# Patient Record
Sex: Female | Born: 1990 | Race: Black or African American | Hispanic: No | Marital: Single | Smoking: Never smoker
Health system: Southern US, Community
[De-identification: ages and names within clinical notes are randomized; demographics above are authoritative.]

## PROBLEM LIST (undated history)

## (undated) DIAGNOSIS — I10 Essential (primary) hypertension: Secondary | ICD-10-CM

---

## 2013-03-26 ENCOUNTER — Encounter (HOSPITAL_COMMUNITY): Payer: Self-pay | Admitting: Emergency Medicine

## 2013-03-26 ENCOUNTER — Emergency Department (HOSPITAL_COMMUNITY)
Admission: EM | Admit: 2013-03-26 | Discharge: 2013-03-26 | Disposition: A | Discharge status: Home / Self Care | Payer: Medicaid Other | Attending: Emergency Medicine | Admitting: Emergency Medicine

## 2013-03-26 DIAGNOSIS — K029 Dental caries, unspecified: Secondary | ICD-10-CM | POA: Insufficient documentation

## 2013-03-26 DIAGNOSIS — K002 Abnormalities of size and form of teeth: Secondary | ICD-10-CM | POA: Insufficient documentation

## 2013-03-26 MED ORDER — PENICILLIN V POTASSIUM 500 MG PO TABS: 500.0000 mg | ORAL_TABLET | Freq: Four times a day (QID) | ORAL | Status: DC

## 2013-03-26 MED ORDER — HYDROCODONE-ACETAMINOPHEN 5-325 MG PO TABS: 1.0000 | ORAL_TABLET | Freq: Four times a day (QID) | ORAL | Status: DC | PRN

## 2013-03-26 MED ORDER — IBUPROFEN 800 MG PO TABS: 800.0000 mg | ORAL_TABLET | Freq: Three times a day (TID) | ORAL | Status: DC | PRN

## 2013-03-26 NOTE — ED Notes (Signed)
Pt c/o right bottom jaw toothache x 1 wk; not relieved by Otc

## 2013-03-26 NOTE — ED Provider Notes (Signed)
Medical screening examination/treatment/procedure(s) were performed by non-physician practitioner and as supervising physician I was immediately available for consultation/collaboration.  EKG Interpretation   None         Lyanne Co, MD 03/26/13 [phone]

## 2013-03-26 NOTE — ED Provider Notes (Signed)
CSN: [id number]     Arrival date & time 03/26/13  3474 History   First MD Initiated Contact with Patient 03/26/13 [phone]     Chief Complaint  Patient presents with  . Dental Pain   (Consider location/radiation/quality/duration/timing/severity/associated sxs/prior Treatment) HPI Patient presents to the emergency department with right lower dental pain.  The patient, states, that she's had emesis tooth for quite a while.  Patient, states, that she does not have a local dentist.  Patient does not have any nausea, vomiting, throat swelling, difficulty breathing, difficulty swallowing or neck swelling.  The patient, states, that she tried some over-the-counter dental gel without relief. History reviewed. No pertinent past medical history. History reviewed. No pertinent past surgical history. No family history on file. History  Substance Use Topics  . Smoking status: Never Smoker   . Smokeless tobacco: Not on file  . Alcohol Use: No   OB History   Grav Para Term Preterm Abortions TAB SAB Ect Mult Living                 Review of Systems All other systems negative except as documented in the HPI. All pertinent positives and negatives as reviewed in the HPI. Allergies  Review of patient's allergies indicates no known allergies.  Home Medications  No current outpatient prescriptions on file. BP 124/85  Pulse 101  Temp(Src) 98.2 F (36.8 C)  Resp 20  SpO2 100%  LMP 01/24/2013 Physical Exam  Nursing note and vitals reviewed. Constitutional: She appears well-developed and well-nourished. No distress.  HENT:  Head: Normocephalic and atraumatic.  Mouth/Throat: Uvula is midline and oropharynx is clear and moist. No trismus in the jaw. Abnormal dentition. Dental caries present. No uvula swelling.    Neck: Normal range of motion. Neck supple. No tracheal deviation present. No thyromegaly present.  Pulmonary/Chest: Effort normal.  Lymphadenopathy:    She has no cervical adenopathy.    Skin: Skin is warm and dry.    ED Course  Procedures (including critical care time) Patient be referred to the on-call dentist and placed on antibiotic for pain control.  She is advised to return here as needed.  Told to rinse with warm water and peroxide 3 times a day  Carlyle Dolly, PA-C 03/26/13 [phone]
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
## 2013-05-01 ENCOUNTER — Encounter (HOSPITAL_COMMUNITY): Payer: Self-pay | Admitting: Emergency Medicine

## 2013-05-01 ENCOUNTER — Emergency Department (HOSPITAL_COMMUNITY)
Admission: EM | Admit: 2013-05-01 | Discharge: 2013-05-01 | Disposition: A | Discharge status: Home / Self Care | Payer: Medicaid Other | Attending: Emergency Medicine | Admitting: Emergency Medicine

## 2013-05-01 DIAGNOSIS — R109 Unspecified abdominal pain: Secondary | ICD-10-CM | POA: Insufficient documentation

## 2013-05-01 DIAGNOSIS — Z3201 Encounter for pregnancy test, result positive: Secondary | ICD-10-CM | POA: Insufficient documentation

## 2013-05-01 DIAGNOSIS — Z3490 Encounter for supervision of normal pregnancy, unspecified, unspecified trimester: Secondary | ICD-10-CM

## 2013-05-01 DIAGNOSIS — O9989 Other specified diseases and conditions complicating pregnancy, childbirth and the puerperium: Secondary | ICD-10-CM | POA: Insufficient documentation

## 2013-05-01 DIAGNOSIS — Y9389 Activity, other specified: Secondary | ICD-10-CM | POA: Insufficient documentation

## 2013-05-01 LAB — POCT PREGNANCY, URINE: Preg Test, Ur: POSITIVE — AB

## 2013-05-01 LAB — ABO/RH: ABO/RH(D): O POS

## 2013-05-01 LAB — HCG, QUANTITATIVE, PREGNANCY: HCG, BETA CHAIN, QUANT, S: 151889 m[IU]/mL — AB (ref <5)

## 2013-05-01 MED ORDER — ACETAMINOPHEN 500 MG PO TABS: 500.0000 mg | ORAL_TABLET | Freq: Four times a day (QID) | ORAL | Status: DC | PRN
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
## 2013-05-01 NOTE — ED Notes (Signed)
PA at bedside.

## 2013-05-01 NOTE — ED Notes (Signed)
Last menstrual cycle was in October.
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
## 2013-05-01 NOTE — ED Notes (Signed)
Pelvic cart set up at pt bedside. Pt undressed from waist down

## 2013-05-01 NOTE — ED Provider Notes (Signed)
Medical screening examination/treatment/procedure(s) were conducted as a shared visit with non-physician practitioner(s) and myself.  I personally evaluated the patient during the encounter.  EKG Interpretation   None       EMERGENCY DEPARTMENT US PREGNANCY "Study: Limited Ultrasound of the Pelvis"  INDICATIONS:Pregnancy(required) Multiple views of the uterus and pelvic cavity are obtained with a multi-frequency probe.  APPROACH:Transabdominal   PERFORMED BY: Myself  IMAGES ARCHIVED?: Yes  LIMITATIONS: Body habitus  PREGNANCY FREE FLUID: None  PREGNANCY UTERUS FINDINGS:Uterus enlarged and Gestational sac noted ADNEXAL FINDINGS:Left ovary not seen and Right ovary not seen  PREGNANCY FINDINGS: Yolk sac noted, Fetal pole present and Fetal heart activity seen  INTERPRETATION: Viable intrauterine pregnancy  GESTATIONAL AGE, ESTIMATE: 11 weeks   FETAL HEART RATE: 140s  COMMENT(Estimate of Gestational Age):  11 weeks  D/c with ER precautions.  F/U OB.        Darlys Galesavid Tahjir Silveria, MD 05/01/13 [phone]

## 2013-05-01 NOTE — ED Notes (Signed)
Vital signs stable. 

## 2013-05-01 NOTE — ED Provider Notes (Signed)
Medical screening examination/treatment/procedure(s) were performed by non-physician practitioner and as supervising physician I was immediately available for consultation/collaboration.  EKG Interpretation   None       Benign exam.  IUP on TAUS.  rH positive.  OB f/u. ER precautions given.    Darlys Galesavid Davionte Lusby, MD 05/01/13 [phone]
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
## 2013-05-08 ENCOUNTER — Encounter: Payer: Self-pay | Admitting: *Deleted

## 2013-05-22 ENCOUNTER — Encounter: Payer: Self-pay | Admitting: Family Medicine

## 2013-06-19 ENCOUNTER — Emergency Department (HOSPITAL_COMMUNITY)
Admission: EM | Admit: 2013-06-19 | Discharge: 2013-06-19 | Disposition: A | Discharge status: Home / Self Care | Payer: Medicaid Other | Attending: Emergency Medicine | Admitting: Emergency Medicine

## 2013-06-19 ENCOUNTER — Encounter (HOSPITAL_COMMUNITY): Payer: Self-pay | Admitting: Emergency Medicine

## 2013-06-19 DIAGNOSIS — O9989 Other specified diseases and conditions complicating pregnancy, childbirth and the puerperium: Secondary | ICD-10-CM | POA: Insufficient documentation

## 2013-06-19 DIAGNOSIS — K0889 Other specified disorders of teeth and supporting structures: Secondary | ICD-10-CM

## 2013-06-19 DIAGNOSIS — G479 Sleep disorder, unspecified: Secondary | ICD-10-CM | POA: Insufficient documentation

## 2013-06-19 DIAGNOSIS — K089 Disorder of teeth and supporting structures, unspecified: Secondary | ICD-10-CM | POA: Insufficient documentation

## 2013-06-19 MED ORDER — PENICILLIN V POTASSIUM 500 MG PO TABS: 500.0000 mg | ORAL_TABLET | Freq: Four times a day (QID) | ORAL | Status: DC

## 2013-06-19 MED ORDER — BUPIVACAINE-EPINEPHRINE PF 0.5-1:200000 % IJ SOLN
1.8000 mL | Freq: Once | INTRAMUSCULAR | Status: AC
  Administered 2013-06-19: 9 mg

## 2013-06-19 NOTE — Discharge Instructions (Signed)

## 2013-06-19 NOTE — ED Provider Notes (Signed)
CSN: [id number]     Arrival date & time 06/19/13  0725 History   First MD Initiated Contact with Patient 06/19/13 0831     Chief Complaint  Patient presents with  . Dental Pain     (Consider location/radiation/quality/duration/timing/severity/associated sxs/prior Treatment) HPI Comments: Patient presents to the emergency department with chief complaint of dental pain. She states pain started approximately week ago. She is [redacted] weeks pregnant. She is tried taking OTC Tylenol with little relief. She states the pain is severe. It does not radiate. She denies any associated fevers or chills. [place] and asks for referral.  The history is provided by the patient. No language interpreter was used.    History reviewed. No pertinent past medical history. History reviewed. No pertinent past surgical history. No family history on file. History  Substance Use Topics  . Smoking status: Never Smoker   . Smokeless tobacco: Not on file  . Alcohol Use: No   OB History   Grav Para Term Preterm Abortions TAB SAB Ect Mult Living   1              Review of Systems  Constitutional: Negative for fever and chills.  HENT: Positive for dental problem. Negative for drooling.   Neurological: Negative for speech difficulty.  Psychiatric/Behavioral: Positive for sleep disturbance.      Allergies  Review of patient's allergies indicates no known allergies.  Home Medications   Current Outpatient Rx  Name  Route  Sig  Dispense  Refill  . acetaminophen (TYLENOL) 500 MG tablet   Oral   Take 500-1,000 mg by mouth every 4 (four) hours as needed for mild pain.          BP 123/90  Pulse 75  Temp(Src) 98.6 F (37 C)  Resp 20  SpO2 100%  LMP 02/16/2013 Physical Exam  Nursing note and vitals reviewed. Constitutional: She is oriented to person, place, and time. She appears well-developed and well-nourished.  HENT:  Head: Normocephalic and atraumatic.    Mouth/Throat:    Poor dentition throughout.  Affected tooth as diagrammed.  No signs of peritonsillar or tonsillar abscess.  No signs of gingival abscess. Oropharynx is clear and without exudates.  Uvula is midline.  Airway is intact. No signs of Ludwig's angina with palpation of oral and sublingual mucosa.   Eyes: Conjunctivae and EOM are normal.  Neck: Normal range of motion.  Cardiovascular: Normal rate.   Pulmonary/Chest: Effort normal.  Abdominal: She exhibits no distension.  Musculoskeletal: Normal range of motion.  Neurological: She is alert and oriented to person, place, and time.  Skin: Skin is dry.  Psychiatric: She has a normal mood and affect. Her behavior is normal. Judgment and thought content normal.    ED Course  Dental Date/Time: 06/19/2013 2:38 PM Performed by: Roxy HorsemanBROWNING, Odette Watanabe Authorized by: Roxy HorsemanBROWNING, Davionne Dowty Consent: Verbal consent obtained. Risks and benefits: risks, benefits and alternatives were discussed Consent given by: patient Patient understanding: patient states understanding of the procedure being performed Patient consent: the patient's understanding of the procedure matches consent given Procedure consent: procedure consent matches procedure scheduled Relevant documents: relevant documents present and verified Test results: test results available and properly labeled Site marked: the operative site was marked Imaging studies: imaging studies available Required items: required blood products, implants, devices, and special equipment available Patient identity confirmed: verbally with patient Time out: Immediately prior to procedure a "time out" was called to verify the correct patient, procedure,  equipment, support staff and site/side marked as required. Preparation: Patient was prepped and draped in the usual sterile fashion. Local anesthesia used: yes Anesthesia: local infiltration Local anesthetic: bupivacaine 0.5% with epinephrine Anesthetic  total: 1.8 ml Patient sedated: no Patient tolerance: Patient tolerated the procedure well with no immediate complications.   (including critical care time) Labs Review Labs Reviewed - No data to display Imaging Review No results found.   EKG Interpretation None      MDM   Final diagnoses:  Pain, dental    Patient with toothache.  No gross abscess.  Exam unconcerning for Ludwig's angina or spread of infection.  Will treat with penicillin and tylenol.  Urged patient to follow-up with dentist.      Roxy Horseman, PA-C 06/19/13 1439

## 2013-06-19 NOTE — ED Notes (Signed)
Top right toothache not relieved by otc; states is approximately [redacted] wks pregnant

## 2013-06-20 NOTE — ED Provider Notes (Signed)
Medical screening examination/treatment/procedure(s) were performed by non-physician practitioner and as supervising physician I was immediately available for consultation/collaboration.   EKG Interpretation None        Nalani Andreen, MD 06/20/13 0748 

## 2013-06-27 ENCOUNTER — Encounter: Payer: Self-pay | Admitting: Advanced Practice Midwife

## 2013-07-02 ENCOUNTER — Encounter: Payer: Self-pay | Admitting: Obstetrics & Gynecology

## 2013-07-28 ENCOUNTER — Emergency Department (HOSPITAL_COMMUNITY)
Admission: EM | Admit: 2013-07-28 | Discharge: 2013-07-28 | Disposition: A | Discharge status: Other Acute Inpatient Hospital | Payer: Medicaid Other | Source: Home / Self Care | Attending: Emergency Medicine | Admitting: Emergency Medicine

## 2013-07-28 ENCOUNTER — Emergency Department (HOSPITAL_COMMUNITY): Payer: Medicaid Other

## 2013-07-28 ENCOUNTER — Inpatient Hospital Stay (HOSPITAL_COMMUNITY)
Admission: EM | Admit: 2013-07-28 | Discharge: 2013-07-31 | DRG: 774 | Disposition: A | Discharge status: Home / Self Care | Payer: Medicaid Other | Source: Ambulatory Visit | Attending: Obstetrics & Gynecology | Admitting: Obstetrics & Gynecology

## 2013-07-28 ENCOUNTER — Inpatient Hospital Stay (HOSPITAL_COMMUNITY): Payer: Medicaid Other

## 2013-07-28 ENCOUNTER — Encounter (HOSPITAL_COMMUNITY): Payer: Self-pay | Admitting: Emergency Medicine

## 2013-07-28 ENCOUNTER — Encounter (HOSPITAL_COMMUNITY): Payer: Self-pay | Admitting: *Deleted

## 2013-07-28 DIAGNOSIS — Y9379 Activity, other specified sports and athletics: Secondary | ICD-10-CM | POA: Diagnosis not present

## 2013-07-28 DIAGNOSIS — O151 Eclampsia in labor: Secondary | ICD-10-CM | POA: Diagnosis present

## 2013-07-28 DIAGNOSIS — O0930 Supervision of pregnancy with insufficient antenatal care, unspecified trimester: Secondary | ICD-10-CM

## 2013-07-28 DIAGNOSIS — O411090 Infection of amniotic sac and membranes, unspecified, unspecified trimester, not applicable or unspecified: Secondary | ICD-10-CM | POA: Diagnosis present

## 2013-07-28 DIAGNOSIS — O364XX0 Maternal care for intrauterine death, not applicable or unspecified: Secondary | ICD-10-CM | POA: Diagnosis present

## 2013-07-28 DIAGNOSIS — O1500 Eclampsia in pregnancy, unspecified trimester: Secondary | ICD-10-CM | POA: Diagnosis present

## 2013-07-28 DIAGNOSIS — W1809XA Striking against other object with subsequent fall, initial encounter: Secondary | ICD-10-CM | POA: Diagnosis present

## 2013-07-28 DIAGNOSIS — Y92009 Unspecified place in unspecified non-institutional (private) residence as the place of occurrence of the external cause: Secondary | ICD-10-CM

## 2013-07-28 DIAGNOSIS — O159 Eclampsia, unspecified as to time period: Secondary | ICD-10-CM

## 2013-07-28 DIAGNOSIS — R561 Post traumatic seizures: Secondary | ICD-10-CM

## 2013-07-28 DIAGNOSIS — O1502 Eclampsia in pregnancy, second trimester: Secondary | ICD-10-CM

## 2013-07-28 LAB — COMPREHENSIVE METABOLIC PANEL
ALBUMIN: 3.1 g/dL — AB (ref 3.5–5.2)
ALT: 18 U/L (ref 0–35)
ALT: 19 U/L (ref 0–35)
AST: 42 U/L — ABNORMAL HIGH (ref 0–37)
AST: 37 U/L (ref 0–37)
Albumin: 2.7 g/dL — ABNORMAL LOW (ref 3.5–5.2)
Alkaline Phosphatase: 118 U/L — ABNORMAL HIGH (ref 39–117)
Alkaline Phosphatase: 126 U/L — ABNORMAL HIGH (ref 39–117)
BUN: 11 mg/dL (ref 6–23)
BUN: 12 mg/dL (ref 6–23)
CALCIUM: 8.9 mg/dL (ref 8.4–10.5)
CO2: 17 mEq/L — ABNORMAL LOW (ref 19–32)
CO2: 20 mEq/L (ref 19–32)
Calcium: 9.1 mg/dL (ref 8.4–10.5)
Chloride: 103 mEq/L (ref 96–112)
Chloride: 99 mEq/L (ref 96–112)
Creatinine, Ser: 0.84 mg/dL (ref 0.50–1.10)
Creatinine, Ser: 0.8 mg/dL (ref 0.50–1.10)
GFR calc Af Amer: >90 mL/min (ref >90)
GFR calc non Af Amer: >90 mL/min (ref >90)
Glucose, Bld: 87 mg/dL (ref 70–99)
Glucose, Bld: 125 mg/dL — ABNORMAL HIGH (ref 70–99)
Potassium: 4.4 mEq/L (ref 3.7–5.3)
Potassium: 4.5 mEq/L (ref 3.7–5.3)
SODIUM: 137 meq/L (ref 137–147)
SODIUM: 135 meq/L — AB (ref 137–147)
TOTAL PROTEIN: 6.5 g/dL (ref 6.0–8.3)
TOTAL PROTEIN: 6.8 g/dL (ref 6.0–8.3)
Total Bilirubin: 0.2 mg/dL — ABNORMAL LOW (ref 0.3–1.2)
Total Bilirubin: 0.3 mg/dL (ref 0.3–1.2)

## 2013-07-28 LAB — CBC
HCT: 34.9 % — ABNORMAL LOW (ref 36.0–46.0)
Hemoglobin: 11 g/dL — ABNORMAL LOW (ref 12.0–15.0)
MCH: 22.8 pg — AB (ref 26.0–34.0)
MCHC: 31.5 g/dL (ref 30.0–36.0)
MCV: 72.3 fL — AB (ref 78.0–100.0)
PLATELETS: 285 10*3/uL (ref 150–400)
RBC: 4.83 MIL/uL (ref 3.87–5.11)
RDW: 18.9 % — AB (ref 11.5–15.5)
WBC: 17.3 10*3/uL — ABNORMAL HIGH (ref 4.0–10.5)

## 2013-07-28 LAB — CBC WITH DIFFERENTIAL/PLATELET
Basophils Absolute: 0.1 10*3/uL (ref 0.0–0.1)
Basophils Relative: 1 % (ref 0–1)
Eosinophils Absolute: 0.4 10*3/uL (ref 0.0–0.7)
Eosinophils Relative: 4 % (ref 0–5)
HEMATOCRIT: 30.7 % — AB (ref 36.0–46.0)
Hemoglobin: 9.4 g/dL — ABNORMAL LOW (ref 12.0–15.0)
LYMPHS PCT: 25 % (ref 12–46)
Lymphs Abs: 2.7 10*3/uL (ref 0.7–4.0)
MCH: 22.5 pg — ABNORMAL LOW (ref 26.0–34.0)
MCHC: 30.6 g/dL (ref 30.0–36.0)
MCV: 73.4 fL — ABNORMAL LOW (ref 78.0–100.0)
MONOS PCT: 4 % (ref 3–12)
Monocytes Absolute: 0.5 10*3/uL (ref 0.1–1.0)
NEUTROS ABS: 7.3 10*3/uL (ref 1.7–7.7)
NEUTROS PCT: 67 % (ref 43–77)
Platelets: 251 10*3/uL (ref 150–400)
RBC: 4.18 MIL/uL (ref 3.87–5.11)
RDW: 18.2 % — ABNORMAL HIGH (ref 11.5–15.5)
WBC: 11 10*3/uL — AB (ref 4.0–10.5)

## 2013-07-28 LAB — I-STAT CG4 LACTIC ACID, ED: Lactic Acid, Venous: 5.49 mmol/L — ABNORMAL HIGH (ref 0.5–2.2)

## 2013-07-28 LAB — TYPE AND SCREEN
ABO/RH(D): O POS
ABO/RH(D): O POS
ANTIBODY SCREEN: NEGATIVE
ANTIBODY SCREEN: NEGATIVE

## 2013-07-28 LAB — URINE MICROSCOPIC-ADD ON

## 2013-07-28 LAB — URINALYSIS, ROUTINE W REFLEX MICROSCOPIC
Bilirubin Urine: NEGATIVE
Glucose, UA: NEGATIVE mg/dL
KETONES UR: NEGATIVE mg/dL
Leukocytes, UA: NEGATIVE
NITRITE: NEGATIVE
Protein, ur: 100 mg/dL — AB
SPECIFIC GRAVITY, URINE: 1.025 (ref 1.005–1.030)
Urobilinogen, UA: 0.2 mg/dL (ref 0.0–1.0)
pH: 6 (ref 5.0–8.0)

## 2013-07-28 LAB — CBG MONITORING, ED: Glucose-Capillary: 95 mg/dL (ref 70–99)

## 2013-07-28 LAB — ETHANOL

## 2013-07-28 LAB — SALICYLATE LEVEL: Salicylate Lvl: <2 mg/dL — ABNORMAL LOW (ref 2.8–20.0)

## 2013-07-28 LAB — ACETAMINOPHEN LEVEL

## 2013-07-28 LAB — CK: CK TOTAL: 166 U/L (ref 7–177)

## 2013-07-28 LAB — PROTEIN / CREATININE RATIO, URINE
Creatinine, Urine: 51.92 mg/dL
Protein Creatinine Ratio: 6.67 — ABNORMAL HIGH (ref 0.00–0.15)
Total Protein, Urine: 346.5 mg/dL

## 2013-07-28 LAB — HCG, QUANTITATIVE, PREGNANCY: HCG, BETA CHAIN, QUANT, S: 12377 m[IU]/mL — AB (ref <5)

## 2013-07-28 MED ORDER — HYDRALAZINE HCL 20 MG/ML IJ SOLN
10.0000 mg | Freq: Once | INTRAMUSCULAR | Status: AC
  Administered 2013-07-28: 10 mg via INTRAVENOUS
  Filled 2013-07-28: qty 1

## 2013-07-28 MED ORDER — LACTATED RINGERS IV BOLUS (SEPSIS)
300.0000 mL | Freq: Once | INTRAVENOUS | Status: AC
  Administered 2013-07-28: 300 mL via INTRAVENOUS

## 2013-07-28 MED ORDER — PRENATAL MULTIVITAMIN CH: 1.0000 | ORAL_TABLET | Freq: Every day | ORAL | Status: DC

## 2013-07-28 MED ORDER — MAGNESIUM SULFATE 40 G IN LACTATED RINGERS - SIMPLE
1.0000 g/h | INTRAVENOUS | Status: DC
  Administered 2013-07-29: 1 g/h via INTRAVENOUS
  Filled 2013-07-28 (×2): qty 500

## 2013-07-28 MED ORDER — ACETAMINOPHEN 325 MG PO TABS
650.0000 mg | ORAL_TABLET | ORAL | Status: DC | PRN
  Administered 2013-07-28 – 2013-07-29 (×2): 650 mg via ORAL
  Filled 2013-07-28 (×2): qty 2

## 2013-07-28 MED ORDER — CALCIUM CARBONATE ANTACID 500 MG PO CHEW: 2.0000 | CHEWABLE_TABLET | ORAL | Status: DC | PRN

## 2013-07-28 MED ORDER — MAGNESIUM SULFATE 40 G IN LACTATED RINGERS - SIMPLE
2.0000 g/h | INTRAVENOUS | Status: DC
  Administered 2013-07-28: 2 g/h via INTRAVENOUS
  Filled 2013-07-28: qty 500

## 2013-07-28 MED ORDER — NALBUPHINE HCL 10 MG/ML IJ SOLN
10.0000 mg | INTRAMUSCULAR | Status: DC | PRN
  Administered 2013-07-28 – 2013-07-29 (×2): 10 mg via INTRAVENOUS
  Filled 2013-07-28 (×2): qty 1

## 2013-07-28 MED ORDER — LACTATED RINGERS IV BOLUS (SEPSIS)
500.0000 mL | Freq: Once | INTRAVENOUS | Status: AC
  Administered 2013-07-28: 09:00:00 via INTRAVENOUS

## 2013-07-28 MED ORDER — BETAMETHASONE SOD PHOS & ACET 6 (3-3) MG/ML IJ SUSP
12.0000 mg | INTRAMUSCULAR | Status: AC
  Administered 2013-07-28 – 2013-07-29 (×2): 12 mg via INTRAMUSCULAR
  Filled 2013-07-28 (×2): qty 2

## 2013-07-28 MED ORDER — SODIUM CHLORIDE 0.9 % IV BOLUS (SEPSIS)
1000.0000 mL | Freq: Once | INTRAVENOUS | Status: DC
Start: 2013-07-28 — End: 2013-07-28

## 2013-07-28 MED ORDER — ZOLPIDEM TARTRATE 5 MG PO TABS: 5.0000 mg | ORAL_TABLET | Freq: Every evening | ORAL | Status: DC | PRN

## 2013-07-28 MED ORDER — LACTATED RINGERS IV SOLN
INTRAVENOUS | Status: DC
  Administered 2013-07-28 – 2013-07-29 (×5): via INTRAVENOUS

## 2013-07-28 MED ORDER — HYDRALAZINE HCL 20 MG/ML IJ SOLN: 10.0000 mg | Freq: Once | INTRAMUSCULAR | Status: DC

## 2013-07-28 MED ORDER — ONDANSETRON HCL 4 MG/2ML IJ SOLN
4.0000 mg | Freq: Once | INTRAMUSCULAR | Status: AC
  Administered 2013-07-28: 4 mg via INTRAVENOUS
  Filled 2013-07-28: qty 2

## 2013-07-28 MED ORDER — DOCUSATE SODIUM 100 MG PO CAPS
100.0000 mg | ORAL_CAPSULE | Freq: Every day | ORAL | Status: DC
Start: 2013-07-28 — End: 2013-07-30

## 2013-07-28 MED ORDER — MAGNESIUM SULFATE 40 MG/ML IJ SOLN
4.0000 g | Freq: Once | INTRAMUSCULAR | Status: AC
  Administered 2013-07-28: 4 g via INTRAVENOUS
  Filled 2013-07-28: qty 100
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
## 2013-07-28 NOTE — Progress Notes (Signed)
Spoke with Belenda CruiseHeather Mitchell RNC, Care Coordinator, [facility], L&D. Pt is to go to room 170 when she is transferred.

## 2013-07-28 NOTE — ED Notes (Signed)
Abnormal Labs given to Dr. Bebe ShaggyWickline Lactic Acid 5.49 mmol/L

## 2013-07-28 NOTE — Progress Notes (Signed)
Dr Debroah LoopArnold at bedside to assess patient. Urine output reviewed with 300ml bolus ordered at this time. Zofran also ordered for nausea/vomiting. Vitals reviewed. Monitoring closely.
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
## 2013-07-28 NOTE — Progress Notes (Signed)
Patient ID: Jaclyne Haverstick, female   DOB: [birth year], 23 y.o.   MRN: [id number] FACULTY PRACTICE ANTEPARTUM(COMPREHENSIVE) NOTE 2 Emireth Gerlich is a 23 y.o. G1P0 at [redacted]w[redacted]d by midtrimester ultrasound who is admitted for eclampsia.   Fetal presentation is cephalic. Length of Stay:  0  Days  Subjective: Headache is better, has nausea Patient reports the fetal movement as active. Patient reports uterine contraction  activity as none. Patient reports  vaginal bleeding as none. Patient describes fluid per vagina as None.  Vitals:  Blood pressure 159/91, pulse 113, temperature 98.1 F (36.7 C), temperature source Oral, resp. rate 16, height 5\' 7"  (1.702 m), weight 120 lb (54.432 kg), last menstrual period 02/16/2013, SpO2 99.00%. Physical Examination:  General appearance - appears fatigued, face mildly puffy Heart - normal rate and regular rhythm Abdomen - soft, nontender, nondistended Fundal Height:  Size equals dates Cervical Exam: Not evaluated.  Extremities: extremities normal, atraumatic,  Membranes:intact  Fetal Monitoring:  Baseline: 130 bpm, Variability: Fair (1-6 bpm), Accelerations: Non-reactive but appropriate for gestational age and Decelerations: Absent  Labs:  Results for orders placed during the hospital encounter of 07/28/13 (from the past 24 hour(s))  URINALYSIS, ROUTINE W REFLEX MICROSCOPIC   Collection Time    07/28/13  9:20 AM      Result Value Ref Range   Color, Urine YELLOW  YELLOW   APPearance CLEAR  CLEAR   Specific Gravity, Urine 1.025  1.005 - 1.030   pH 6.0  5.0 - 8.0   Glucose, UA NEGATIVE  NEGATIVE mg/dL   Hgb urine dipstick SMALL (*) NEGATIVE   Bilirubin Urine NEGATIVE  NEGATIVE   Ketones, ur NEGATIVE  NEGATIVE mg/dL   Protein, ur 409 (*) NEGATIVE mg/dL   Urobilinogen, UA 0.2  0.0 - 1.0 mg/dL   Nitrite NEGATIVE  NEGATIVE   Leukocytes, UA NEGATIVE  NEGATIVE  PROTEIN / CREATININE RATIO, URINE   Collection Time    07/28/13  9:20 AM      Result  Value Ref Range   Creatinine, Urine 51.92     Total Protein, Urine 346.5     PROTEIN CREATININE RATIO 6.67 (*) 0.00 - 0.15  URINE MICROSCOPIC-ADD ON   Collection Time    07/28/13  9:20 AM      Result Value Ref Range   Squamous Epithelial / LPF RARE  RARE   RBC / HPF 0-2  <3 RBC/hpf   Casts GRANULAR CAST (*) NEGATIVE  CBC   Collection Time    07/28/13  6:55 PM      Result Value Ref Range   WBC 17.3 (*) 4.0 - 10.5 K/uL   RBC 4.83  3.87 - 5.11 MIL/uL   Hemoglobin 11.0 (*) 12.0 - 15.0 g/dL   HCT 81.1 (*) 91.4 - 78.2 %   MCV 72.3 (*) 78.0 - 100.0 fL   MCH 22.8 (*) 26.0 - 34.0 pg   MCHC 31.5  30.0 - 36.0 g/dL   RDW 95.6 (*) 21.3 - 08.6 %   Platelets 285  150 - 400 K/uL  COMPREHENSIVE METABOLIC PANEL   Collection Time    07/28/13  6:55 PM      Result Value Ref Range   Sodium 135 (*) 137 - 147 mEq/L   Potassium 4.5  3.7 - 5.3 mEq/L   Chloride 99  96 - 112 mEq/L   CO2 20  19 - 32 mEq/L   Glucose, Bld 125 (*) 70 - 99 mg/dL   BUN 12  6 -  23 mg/dL   Creatinine, Ser 6.96  0.50 - 1.10 mg/dL   Calcium 8.9  8.4 - 29.5 mg/dL   Total Protein 6.8  6.0 - 8.3 g/dL   Albumin 3.1 (*) 3.5 - 5.2 g/dL   AST 37  0 - 37 U/L   ALT 19  0 - 35 U/L   Alkaline Phosphatase 126 (*) 39 - 117 U/L   Total Bilirubin 0.3  0.3 - 1.2 mg/dL   GFR calc non Af Amer >90  >90 mL/min   GFR calc Af Amer >90  >90 mL/min  MAGNESIUM   Collection Time    07/28/13  6:55 PM      Result Value Ref Range   Magnesium 7.7 (*) 1.5 - 2.5 mg/dL  TYPE AND SCREEN   Collection Time    07/28/13  6:55 PM      Result Value Ref Range   ABO/RH(D) O POS     Antibody Screen NEG     Sample Expiration 07/31/2013    CBG MONITORING, ED   Collection Time    07/28/13  7:14 AM      Result Value Ref Range   Glucose-Capillary 95  70 - 99 mg/dL  TYPE AND SCREEN   Collection Time    07/28/13  7:34 AM      Result Value Ref Range   ABO/RH(D) O POS     Antibody Screen NEG     Sample Expiration 07/31/2013    COMPREHENSIVE METABOLIC  PANEL   Collection Time    07/28/13  7:43 AM      Result Value Ref Range   Sodium 137  137 - 147 mEq/L   Potassium 4.4  3.7 - 5.3 mEq/L   Chloride 103  96 - 112 mEq/L   CO2 17 (*) 19 - 32 mEq/L   Glucose, Bld 87  70 - 99 mg/dL   BUN 11  6 - 23 mg/dL   Creatinine, Ser 2.84  0.50 - 1.10 mg/dL   Calcium 9.1  8.4 - 13.2 mg/dL   Total Protein 6.5  6.0 - 8.3 g/dL   Albumin 2.7 (*) 3.5 - 5.2 g/dL   AST 42 (*) 0 - 37 U/L   ALT 18  0 - 35 U/L   Alkaline Phosphatase 118 (*) 39 - 117 U/L   Total Bilirubin 0.2 (*) 0.3 - 1.2 mg/dL   GFR calc non Af Amer >90  >90 mL/min   GFR calc Af Amer >90  >90 mL/min  CBC WITH DIFFERENTIAL   Collection Time    07/28/13  7:43 AM      Result Value Ref Range   WBC 11.0 (*) 4.0 - 10.5 K/uL   RBC 4.18  3.87 - 5.11 MIL/uL   Hemoglobin 9.4 (*) 12.0 - 15.0 g/dL   HCT 44.0 (*) 10.2 - 72.5 %   MCV 73.4 (*) 78.0 - 100.0 fL   MCH 22.5 (*) 26.0 - 34.0 pg   MCHC 30.6  30.0 - 36.0 g/dL   RDW 36.6 (*) 44.0 - 34.7 %   Platelets 251  150 - 400 K/uL   Neutrophils Relative % 67  43 - 77 %   Neutro Abs 7.3  1.7 - 7.7 K/uL   Lymphocytes Relative 25  12 - 46 %   Lymphs Abs 2.7  0.7 - 4.0 K/uL   Monocytes Relative 4  3 - 12 %   Monocytes Absolute 0.5  0.1 - 1.0 K/uL   Eosinophils Relative  4  0 - 5 %   Eosinophils Absolute 0.4  0.0 - 0.7 K/uL   Basophils Relative 1  0 - 1 %   Basophils Absolute 0.1  0.0 - 0.1 K/uL  ACETAMINOPHEN LEVEL   Collection Time    07/28/13  7:43 AM      Result Value Ref Range   Acetaminophen (Tylenol), Serum <15.0  10 - 30 ug/mL  CK   Collection Time    07/28/13  7:43 AM      Result Value Ref Range   Total CK 166  7 - 177 U/L  ETHANOL   Collection Time    07/28/13  7:43 AM      Result Value Ref Range   Alcohol, Ethyl (B) <11  0 - 11 mg/dL  SALICYLATE LEVEL   Collection Time    07/28/13  7:43 AM      Result Value Ref Range   Salicylate Lvl <2.0 (*) 2.8 - 20.0 mg/dL  HCG, QUANTITATIVE, PREGNANCY   Collection Time    07/28/13  7:43  AM      Result Value Ref Range   hCG, Beta Chain, Quant, S [id number] (*) <5 mIU/mL  I-STAT CG4 LACTIC ACID, ED   Collection Time    07/28/13  8:02 AM      Result Value Ref Range   Lactic Acid, Venous 5.49 (*) 0.5 - 2.2 mmol/L    Imaging Studies:      Currently EPIC will not allow sonographic studies to automatically populate into notes.  In the meantime, copy and paste results into note or free text.  Medications:  Scheduled . betamethasone acetate-betamethasone sodium phosphate  12 mg Intramuscular Q24H  . docusate sodium  100 mg Oral Daily  . hydrALAZINE  10 mg Intramuscular Once  . prenatal multivitamin  1 tablet Oral Q1200   I have reviewed the patient's current medications.  ASSESSMENT: Patient Active Problem List   Diagnosis Date Noted  . Eclampsia complicating pregnancy in second trimester 07/28/2013  . Seizure after head injury 07/28/2013    PLAN: Magnesium rate decreased, follow urine output and BP, MFM  Assessment in am  Adam PhenixJames G Asier Desroches 07/28/2013,9:33 PM

## 2013-07-28 NOTE — ED Notes (Signed)
[facility] OB at bedside 

## 2013-07-28 NOTE — ED Notes (Signed)
Per EMS - pt's boyfriend woke up and saw pt having seizure-like activity, called ems. Upon Ems arrival, pt was post-ictal. Pt fell last night, hit head on the concrete. No hx of seizures. En route pt had another seizure, ems started 20 G in left forearm and administered 5 mg of versed. Pt also recently found out she was pregnant, unsure how far along. HR 128, CBG 95, 100% on room air, lung sounds clear. Right sided gaze.

## 2013-07-28 NOTE — Progress Notes (Signed)
Pt unresponsive. Moves when touched, but does not respond to voice. PLaced on fetal monitor. FHR 150 BPM.
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
## 2013-07-28 NOTE — Progress Notes (Addendum)
Medicated for high BP.  Lab at bedside for blood draw.

## 2013-07-28 NOTE — ED Notes (Signed)
Pt transported by RN to CT.

## 2013-07-28 NOTE — Progress Notes (Signed)
Spoke with Belenda CruiseHeather Mitchell RNC. Pt is [facility] by [facility].

## 2013-07-28 NOTE — Progress Notes (Signed)
US at bedside.  Decel down to 40 noted on US for 10 sec.

## 2013-07-28 NOTE — Progress Notes (Addendum)
CRITICAL VALUE ALERT  Critical value received:  Magnesium 7.7  Date of notification:  07/28/13  Time of notification:  2003  Critical value read back:yes  Nurse who received alert:  h Awa Bachicha rnc  MD notified (1st page):  Hart RochesterLawson, CNM  Time of first page:  2005  MD notified (2nd page):  Time of second page:  Responding MD:  Dickey GaveLawson CNM  Time MD responded:  2005

## 2013-07-28 NOTE — Progress Notes (Signed)
Hart RochesterLawson, CNM notified of mag level of 7.7. Plan to decrease magnesium to 1gram/hr. Urine output reviewed. Continuing to monitor closely.

## 2013-07-28 NOTE — Progress Notes (Signed)
SCD's in place.

## 2013-07-29 ENCOUNTER — Encounter (HOSPITAL_COMMUNITY): Payer: Medicaid Other | Admitting: Anesthesiology

## 2013-07-29 ENCOUNTER — Inpatient Hospital Stay (HOSPITAL_COMMUNITY): Payer: Medicaid Other | Admitting: Anesthesiology

## 2013-07-29 ENCOUNTER — Encounter (HOSPITAL_COMMUNITY): Payer: Self-pay | Admitting: Anesthesiology

## 2013-07-29 ENCOUNTER — Encounter (HOSPITAL_COMMUNITY): Payer: Self-pay | Admitting: Obstetrics & Gynecology

## 2013-07-29 ENCOUNTER — Ambulatory Visit (HOSPITAL_COMMUNITY): Payer: Medicaid Other

## 2013-07-29 DIAGNOSIS — O364XX0 Maternal care for intrauterine death, not applicable or unspecified: Secondary | ICD-10-CM

## 2013-07-29 DIAGNOSIS — O411090 Infection of amniotic sac and membranes, unspecified, unspecified trimester, not applicable or unspecified: Secondary | ICD-10-CM

## 2013-07-29 DIAGNOSIS — O0930 Supervision of pregnancy with insufficient antenatal care, unspecified trimester: Secondary | ICD-10-CM

## 2013-07-29 DIAGNOSIS — O151 Eclampsia in labor: Secondary | ICD-10-CM

## 2013-07-29 LAB — CBC
HCT: 31.2 % — ABNORMAL LOW (ref 36.0–46.0)
HEMATOCRIT: 32.2 % — AB (ref 36.0–46.0)
HEMOGLOBIN: 10 g/dL — AB (ref 12.0–15.0)
HEMOGLOBIN: 9.7 g/dL — AB (ref 12.0–15.0)
MCH: 22.5 pg — AB (ref 26.0–34.0)
MCH: 22.6 pg — AB (ref 26.0–34.0)
MCHC: 31.1 g/dL (ref 30.0–36.0)
MCHC: 31.1 g/dL (ref 30.0–36.0)
MCV: 72.4 fL — AB (ref 78.0–100.0)
MCV: 72.6 fL — ABNORMAL LOW (ref 78.0–100.0)
Platelets: 295 10*3/uL (ref 150–400)
Platelets: 263 10*3/uL (ref 150–400)
RBC: 4.45 MIL/uL (ref 3.87–5.11)
RBC: 4.3 MIL/uL (ref 3.87–5.11)
RDW: 19.4 % — ABNORMAL HIGH (ref 11.5–15.5)
RDW: 19.5 % — ABNORMAL HIGH (ref 11.5–15.5)
WBC: 20.6 10*3/uL — ABNORMAL HIGH (ref 4.0–10.5)
WBC: 20.2 10*3/uL — ABNORMAL HIGH (ref 4.0–10.5)

## 2013-07-29 LAB — COMPREHENSIVE METABOLIC PANEL
ALT: 18 U/L (ref 0–35)
ALT: 18 U/L (ref 0–35)
AST: 35 U/L (ref 0–37)
AST: 35 U/L (ref 0–37)
Albumin: 2.9 g/dL — ABNORMAL LOW (ref 3.5–5.2)
Albumin: 2.8 g/dL — ABNORMAL LOW (ref 3.5–5.2)
Alkaline Phosphatase: 111 U/L (ref 39–117)
Alkaline Phosphatase: 104 U/L (ref 39–117)
BILIRUBIN TOTAL: 0.2 mg/dL — AB (ref 0.3–1.2)
BUN: 13 mg/dL (ref 6–23)
BUN: 14 mg/dL (ref 6–23)
CALCIUM: 8.1 mg/dL — AB (ref 8.4–10.5)
CALCIUM: 7.8 mg/dL — AB (ref 8.4–10.5)
CHLORIDE: 102 meq/L (ref 96–112)
CHLORIDE: 104 meq/L (ref 96–112)
CO2: 22 mEq/L (ref 19–32)
CO2: 23 meq/L (ref 19–32)
CREATININE: 0.92 mg/dL (ref 0.50–1.10)
Creatinine, Ser: 0.9 mg/dL (ref 0.50–1.10)
GFR calc Af Amer: >90 mL/min (ref >90)
GFR, EST NON AFRICAN AMERICAN: 86 mL/min — AB (ref >90)
GFR, EST NON AFRICAN AMERICAN: 88 mL/min — AB (ref >90)
GLUCOSE: 96 mg/dL (ref 70–99)
Glucose, Bld: 114 mg/dL — ABNORMAL HIGH (ref 70–99)
Potassium: 4.6 mEq/L (ref 3.7–5.3)
Potassium: 4.8 mEq/L (ref 3.7–5.3)
Sodium: 137 mEq/L (ref 137–147)
Sodium: 138 mEq/L (ref 137–147)
Total Bilirubin: 0.3 mg/dL (ref 0.3–1.2)
Total Protein: 6.6 g/dL (ref 6.0–8.3)
Total Protein: 6.4 g/dL (ref 6.0–8.3)

## 2013-07-29 LAB — PROTEIN, URINE, 24 HOUR
Collection Interval-UPROT: 24 hours
Protein, 24H Urine: 2954 mg/d — ABNORMAL HIGH (ref 50–100)
Protein, Urine: 211 mg/dL
Urine Total Volume-UPROT: 1400 mL

## 2013-07-29 LAB — RPR

## 2013-07-29 LAB — OB RESULTS CONSOLE ABO/RH: RH Type: POSITIVE

## 2013-07-29 LAB — RAPID URINE DRUG SCREEN, HOSP PERFORMED
Amphetamines: NOT DETECTED
Barbiturates: NOT DETECTED
Benzodiazepines: POSITIVE — AB
COCAINE: NOT DETECTED
Tetrahydrocannabinol: NOT DETECTED

## 2013-07-29 LAB — CREATININE, URINE, 24 HOUR
CREATININE 24H UR: 1008 mg/d (ref 700–1800)
CREATININE, URINE: 72 mg/dL
Collection Interval-UCRE24: 24 hours
Urine Total Volume-UCRE24: 1400 mL

## 2013-07-29 LAB — URIC ACID: Uric Acid, Serum: 7.3 mg/dL — ABNORMAL HIGH (ref 2.4–7.0)

## 2013-07-29 LAB — MAGNESIUM: MAGNESIUM: 7.7 mg/dL — AB (ref 1.5–2.5)

## 2013-07-29 LAB — GROUP B STREP BY PCR: Group B strep by PCR: NEGATIVE

## 2013-07-29 LAB — HEPATITIS B SURFACE ANTIGEN: Hepatitis B Surface Ag: NEGATIVE

## 2013-07-29 LAB — ABO/RH: ABO/RH(D): O POS

## 2013-07-29 LAB — OB RESULTS CONSOLE GBS: GBS: NEGATIVE

## 2013-07-29 MED ORDER — LABETALOL HCL 5 MG/ML IV SOLN
10.0000 mg | INTRAVENOUS | Status: DC | PRN
  Administered 2013-07-29 – 2013-07-31 (×4): 10 mg via INTRAVENOUS
  Filled 2013-07-29 (×4): qty 4

## 2013-07-29 MED ORDER — EPHEDRINE 5 MG/ML INJ
10.0000 mg | INTRAVENOUS | Status: DC | PRN
  Filled 2013-07-29: qty 2

## 2013-07-29 MED ORDER — FENTANYL 2.5 MCG/ML BUPIVACAINE 1/10 % EPIDURAL INFUSION (WH - ANES)
INTRAMUSCULAR | Status: DC | PRN
  Administered 2013-07-29: 12 mL/h via EPIDURAL

## 2013-07-29 MED ORDER — DEXTROSE-NACL 5-0.45 % IV SOLN
INTRAVENOUS | Status: DC
  Administered 2013-07-29: 15:00:00 via INTRAVENOUS

## 2013-07-29 MED ORDER — LIDOCAINE HCL (PF) 1 % IJ SOLN
INTRAMUSCULAR | Status: DC | PRN
  Administered 2013-07-29: 4 mL
  Administered 2013-07-29: 3 mL

## 2013-07-29 MED ORDER — MISOPROSTOL 200 MCG PO TABS
50.0000 ug | ORAL_TABLET | ORAL | Status: DC
  Administered 2013-07-29 (×2): 50 ug via VAGINAL
  Filled 2013-07-29 (×2): qty 0.5

## 2013-07-29 MED ORDER — OXYTOCIN 40 UNITS IN LACTATED RINGERS INFUSION - SIMPLE MED
INTRAVENOUS | Status: AC
  Administered 2013-07-29: 40 [IU] via INTRAVENOUS
  Filled 2013-07-29: qty 1000

## 2013-07-29 MED ORDER — FENTANYL 2.5 MCG/ML BUPIVACAINE 1/10 % EPIDURAL INFUSION (WH - ANES)
14.0000 mL/h | INTRAMUSCULAR | Status: DC | PRN
  Filled 2013-07-29: qty 125

## 2013-07-29 MED ORDER — PHENYLEPHRINE 40 MCG/ML (10ML) SYRINGE FOR IV PUSH (FOR BLOOD PRESSURE SUPPORT)
80.0000 ug | PREFILLED_SYRINGE | INTRAVENOUS | Status: DC | PRN
  Filled 2013-07-29: qty 2
  Filled 2013-07-29: qty 10

## 2013-07-29 MED ORDER — OXYTOCIN 40 UNITS IN LACTATED RINGERS INFUSION - SIMPLE MED
62.5000 mL/h | INTRAVENOUS | Status: DC
  Administered 2013-07-29: 40 [IU] via INTRAVENOUS

## 2013-07-29 MED ORDER — LACTATED RINGERS IV SOLN
500.0000 mL | Freq: Once | INTRAVENOUS | Status: AC
  Administered 2013-07-29: 250 mL via INTRAVENOUS

## 2013-07-29 MED ORDER — PHENYLEPHRINE 40 MCG/ML (10ML) SYRINGE FOR IV PUSH (FOR BLOOD PRESSURE SUPPORT)
80.0000 ug | PREFILLED_SYRINGE | INTRAVENOUS | Status: DC | PRN
  Filled 2013-07-29: qty 2

## 2013-07-29 MED ORDER — DIPHENHYDRAMINE HCL 50 MG/ML IJ SOLN: 12.5000 mg | INTRAMUSCULAR | Status: DC | PRN

## 2013-07-29 MED ORDER — LIDOCAINE HCL (PF) 1 % IJ SOLN
INTRAMUSCULAR | Status: AC
  Filled 2013-07-29: qty 30

## 2013-07-29 MED ORDER — NALBUPHINE HCL 10 MG/ML IJ SOLN
10.0000 mg | Freq: Once | INTRAMUSCULAR | Status: DC
  Filled 2013-07-29: qty 1

## 2013-07-29 MED ORDER — DIPHENHYDRAMINE HCL 50 MG/ML IJ SOLN
25.0000 mg | Freq: Once | INTRAMUSCULAR | Status: DC
  Filled 2013-07-29: qty 1

## 2013-07-29 MED ORDER — MAGNESIUM SULFATE 40 G IN LACTATED RINGERS - SIMPLE
2.0000 g/h | INTRAVENOUS | Status: AC
  Administered 2013-07-30: 2 g/h via INTRAVENOUS
  Filled 2013-07-29: qty 500

## 2013-07-29 NOTE — Anesthesia Procedure Notes (Signed)
Epidural Patient location during procedure: OB Start time: 07/29/2013 8:33 PM  Staffing Anesthesiologist: Demontae Antunes A. Performed by: anesthesiologist   Preanesthetic Checklist Completed: patient identified, site marked, surgical consent, pre-op evaluation, timeout performed, IV checked, risks and benefits discussed and monitors and equipment checked  Epidural Patient position: sitting Prep: site prepped and draped and DuraPrep Patient monitoring: continuous pulse ox and blood pressure Approach: midline Location: L3-L4 Injection technique: LOR air  Needle:  Needle type: Tuohy  Needle gauge: 17 G Needle length: 9 cm and 9 Needle insertion depth: 5 cm cm Catheter type: closed end flexible Catheter size: 19 Gauge Catheter at skin depth: 10 cm Test dose: negative and Other  Assessment Events: blood not aspirated, injection not painful, no injection resistance, negative IV test and no paresthesia  Additional Notes Patient identified. Risks and benefits discussed including failed block, incomplete  Pain control, post dural puncture headache, nerve damage, paralysis, blood pressure Changes, nausea, vomiting, reactions to medications-both toxic and allergic and post Partum back pain. All questions were answered. Patient expressed understanding and wished to proceed. Sterile technique was used throughout procedure. Epidural site was Dressed with sterile barrier dressing. No paresthesias, signs of intravascular injection Or signs of intrathecal spread were encountered.  Patient was more comfortable after the epidural was dosed. Please see RN's note for documentation of vital signs and FHR which are stable.

## 2013-07-29 NOTE — Progress Notes (Signed)
Deborah French is a 23 y.o. G1P0 at 2497w1d admitted for induction of labor due to eclampsia.  Subjective:   Objective: BP 158/90  Pulse 102  Temp(Src) 98.2 F (36.8 C) (Axillary)  Resp 16  Ht 5\' 7"  (1.702 m)  Wt 54.432 kg (120 lb)  BMI 18.79 kg/m2  SpO2 99%  LMP 02/16/2013 I/O last 3 completed shifts: In: 4445.1 [P.O.:960; I.V.:2685.1; IV Piggyback:800] Out: 1960 [Urine:1785; Emesis/NG output:175] Total I/O In: 1139.5 [P.O.:10; I.V.:1129.5] Out: 460 [Urine:460]  Filed Vitals:   07/29/13 1708 07/29/13 1713 07/29/13 1718 07/29/13 1722  BP:    153/84  Pulse: 98 94 102 97  Temp:      TempSrc:      Resp:      Height:      Weight:      SpO2: 100% 99% 99%    FHT: not monitoring UC:   No ctx SVE:   Dilation: Closed Effacement (%): Thick Station: -3 Exam by:: Deborah French RNC  Labs: Lab Results  Component Value Date   WBC 20.6* 07/29/2013   HGB 10.0* 07/29/2013   HCT 32.2* 07/29/2013   MCV 72.4* 07/29/2013   PLT 295 07/29/2013   Results for orders placed during the hospital encounter of 07/28/13 (from the past 24 hour(s))  CBC     Status: Abnormal   Collection Time    07/28/13  6:55 PM      Result Value Ref Range   WBC 17.3 (*) 4.0 - 10.5 K/uL   RBC 4.83  3.87 - 5.11 MIL/uL   Hemoglobin 11.0 (*) 12.0 - 15.0 g/dL   HCT 84.634.9 (*) 96.236.0 - 95.246.0 %   MCV 72.3 (*) 78.0 - 100.0 fL   MCH 22.8 (*) 26.0 - 34.0 pg   MCHC 31.5  30.0 - 36.0 g/dL   RDW 84.118.9 (*) 32.411.5 - 40.115.5 %   Platelets 285  150 - 400 K/uL  COMPREHENSIVE METABOLIC PANEL     Status: Abnormal   Collection Time    07/28/13  6:55 PM      Result Value Ref Range   Sodium 135 (*) 137 - 147 mEq/L   Potassium 4.5  3.7 - 5.3 mEq/L   Chloride 99  96 - 112 mEq/L   CO2 20  19 - 32 mEq/L   Glucose, Bld 125 (*) 70 - 99 mg/dL   BUN 12  6 - 23 mg/dL   Creatinine, Ser 0.270.80  0.50 - 1.10 mg/dL   Calcium 8.9  8.4 - 25.310.5 mg/dL   Total Protein 6.8  6.0 - 8.3 g/dL   Albumin 3.1 (*) 3.5 - 5.2 g/dL   AST 37  0 - 37 U/L   ALT  19  0 - 35 U/L   Alkaline Phosphatase 126 (*) 39 - 117 U/L   Total Bilirubin 0.3  0.3 - 1.2 mg/dL   GFR calc non Af Amer >90  >90 mL/min   GFR calc Af Amer >90  >90 mL/min  TYPE AND SCREEN     Status: None   Collection Time    07/28/13  6:55 PM      Result Value Ref Range   ABO/RH(D) O POS     Antibody Screen NEG     Sample Expiration 07/31/2013    MAGNESIUM     Status: Abnormal   Collection Time    07/28/13  6:55 PM      Result Value Ref Range   Magnesium 7.7 (*) 1.5 - 2.5 mg/dL  ABO/RH     Status: None   Collection Time    07/28/13  6:55 PM      Result Value Ref Range   ABO/RH(D) O POS    RPR     Status: None   Collection Time    07/29/13 11:50 AM      Result Value Ref Range   RPR NON REAC  NON REAC  HEPATITIS B SURFACE ANTIGEN     Status: None   Collection Time    07/29/13 11:50 AM      Result Value Ref Range   Hepatitis B Surface Ag NEGATIVE  NEGATIVE  CBC     Status: Abnormal   Collection Time    07/29/13 11:50 AM      Result Value Ref Range   WBC 20.6 (*) 4.0 - 10.5 K/uL   RBC 4.45  3.87 - 5.11 MIL/uL   Hemoglobin 10.0 (*) 12.0 - 15.0 g/dL   HCT 16.132.2 (*) 09.636.0 - 04.546.0 %   MCV 72.4 (*) 78.0 - 100.0 fL   MCH 22.5 (*) 26.0 - 34.0 pg   MCHC 31.1  30.0 - 36.0 g/dL   RDW 40.919.4 (*) 81.111.5 - 91.415.5 %   Platelets 295  150 - 400 K/uL  COMPREHENSIVE METABOLIC PANEL     Status: Abnormal   Collection Time    07/29/13 11:50 AM      Result Value Ref Range   Sodium 137  137 - 147 mEq/L   Potassium 4.6  3.7 - 5.3 mEq/L   Chloride 102  96 - 112 mEq/L   CO2 22  19 - 32 mEq/L   Glucose, Bld 96  70 - 99 mg/dL   BUN 13  6 - 23 mg/dL   Creatinine, Ser 7.820.92  0.50 - 1.10 mg/dL   Calcium 8.1 (*) 8.4 - 10.5 mg/dL   Total Protein 6.6  6.0 - 8.3 g/dL   Albumin 2.9 (*) 3.5 - 5.2 g/dL   AST 35  0 - 37 U/L   ALT 18  0 - 35 U/L   Alkaline Phosphatase 111  39 - 117 U/L   Total Bilirubin 0.3  0.3 - 1.2 mg/dL   GFR calc non Af Amer 86 (*) >90 mL/min   GFR calc Af Amer >90  >90 mL/min   URIC ACID     Status: Abnormal   Collection Time    07/29/13 11:50 AM      Result Value Ref Range   Uric Acid, Serum 7.3 (*) 2.4 - 7.0 mg/dL  GROUP B STREP BY PCR     Status: None   Collection Time    07/29/13 12:30 PM      Result Value Ref Range   Group B strep by PCR NEGATIVE  NEGATIVE  URINE RAPID DRUG SCREEN (HOSP PERFORMED)     Status: Abnormal   Collection Time    07/29/13 12:30 PM      Result Value Ref Range   Opiates RESULTS UNAVAILABLE DUE TO INTERFERING SUBSTANCE (*) NONE DETECTED   Cocaine NONE DETECTED  NONE DETECTED   Benzodiazepines POSITIVE (*) NONE DETECTED   Amphetamines NONE DETECTED  NONE DETECTED   Tetrahydrocannabinol NONE DETECTED  NONE DETECTED   Barbiturates NONE DETECTED  NONE DETECTED  OB RESULTS CONSOLE GBS     Status: None   Collection Time    07/29/13  4:18 PM      Result Value Ref Range   GBS Negative    OB  RESULTS CONSOLE ABO/RH     Status: None   Collection Time    07/29/13  4:18 PM      Result Value Ref Range   RH Type  Positive     ABO Grouping O      Assessment / Plan: Induction of labor due to eclampsia,  rec'd cytotec  Labor: Progressing normally Preeclampsia:  on magnesium sulfate, intake and ouput balanced and slightly increasing Cr Fetal Wellbeing: Not monitoring. WIll call NICU if possible viability at delivery Pain Control:  Epidural and Fentanyl PRN I/D:  n/a Anticipated MOD:  NSVD  Deborah French 07/29/2013, 5:19 PM

## 2013-07-29 NOTE — Plan of Care (Signed)
Problem: Consults Goal: Birthing Suites Patient Information Press F2 to bring up selections list Outcome: Completed/Met Date Met:  07/29/13  Pt < [redacted] weeks EGA, Inpatient induction, PIH (Pregnancy induced hypertension) and IUFD (Intrauterine fetal demise)

## 2013-07-29 NOTE — Anesthesia Preprocedure Evaluation (Signed)
Anesthesia Evaluation  Patient identified by MRN, date of birth, ID band Patient awake    Reviewed: Allergy & Precautions, H&P , NPO status , Patient's Chart, lab work & pertinent test results  Airway Mallampati: III TM Distance: >3 FB Neck ROM: Full    Dental no notable dental hx. (+) Teeth Intact   Pulmonary neg pulmonary ROS,  breath sounds clear to auscultation  Pulmonary exam normal       Cardiovascular hypertension, Pt. on medications Rhythm:Regular Rate:Normal     Neuro/Psych Seizures -,  Eclampsia this admission negative psych ROS   GI/Hepatic negative GI ROS, Neg liver ROS,   Endo/Other  negative endocrine ROS  Renal/GU negative Renal ROS  negative genitourinary   Musculoskeletal negative musculoskeletal ROS (+)   Abdominal   Peds  Hematology negative hematology ROS (+)   Anesthesia Other Findings   Reproductive/Obstetrics (+) Pregnancy 22 weeks AOG Eclampsia Limited prenatal care                           Anesthesia Physical Anesthesia Plan  ASA: III  Anesthesia Plan: Epidural   Post-op Pain Management:    Induction:   Airway Management Planned: Natural Airway  Additional Equipment:   Intra-op Plan:   Post-operative Plan:   Informed Consent: I have reviewed the patients History and Physical, chart, labs and discussed the procedure including the risks, benefits and alternatives for the proposed anesthesia with the patient or authorized representative who has indicated his/her understanding and acceptance.     Plan Discussed with: Anesthesiologist  Anesthesia Plan Comments:         Anesthesia Quick Evaluation

## 2013-07-29 NOTE — Consult Note (Signed)
Neonatology Consult to Antenatal Patient: 07/29/2013 1:41 PM    I was requested by Dr Erin FullingHarraway-Smith to see this patient in order to provide antenatal counseling due to eclamptic seizures of unknown gestational age.    [name] is a 23 y/o Primigravida with no prenatal care, who was admitted yesterday for eclamptic seizures and unknown gestational age (between 2720-23 weeks???).  Had a history of fall while playing basketball and followed by seizures hours later.  She is currently currently "not" having active labor with elevated BP's in the 160's systolic and 100's diastolic,proteinuria and elevated creatinine level.  She is on Magnesium Sulfate but not on anti-hypertensives medications at present time. Deborah GA composite is 22 1/7 weeks per OB but based on one measurement of the cerebellum from ultrasound yesterday she could be as far along as 23 weeks. However, there were also other measurements suggesting that she could be closer to [redacted] weeks gestation.  Estimated fetal weight is between 423-430 grams.   I spoke with Deborah French and Deborah French in Room 170 in the presence of Dr. Erin FullingHarraway-Smith, Dr. Vincenza HewsQuinn (MFM) and Deborah L&D nurse.   I discussed in detail what to expect in Deborah case of unknown gestational age and estimated fetal weight.   Discussed that based on information from both OB and MFM, infant is most likely not viable.  Morbidity and mortality based on present information is not very promising and was discussed in detail.   There are 2 notable factors that have an impact on chances of survival namely, gestational age and birth weight.  In this case, mother's LMP does not match with the ultrasound data.  Based on [facility], about less than 10% of babies born at 6222- [redacted] weeks gestation survive but it is rare for an infant less than 500 grams to do so. Survival is also based on moderate, severe to profound neurodevelopmental impairment.  Resuscitation of an infant born less than 500 grams is  most probably not an option, given that 2.0 ETT is not available.  I emphasized with the couple that the survival rate is based only on estimation given the uncertainty of dating and again that overall survival rates are low given the estimated fetal size (423-430 grams).   Both Deborah French and Deborah French seem to understand my explanation and that the most important thing right now is the safety of Deborah French since she already had an eclamptic seizure and delivery of the fetus/infant is the only treatment.  They also understand that if there is any question of viability after infant is born, then [facility] to assess viability.   The couple had a few questions which I answered.  Thank you for asking us to see this patient and allowing us to participate in Deborah care.  Please call if there are any further questions.   Overton MamMary Ann T Nezzie Manera, MD (Attending Neonatologist)  Total length of face-to-face or floor/unit time for this encounter was 20 minutes. Counseling and/or coordination of care was greater than fifty percent of the time.

## 2013-07-29 NOTE — Plan of Care (Signed)
Problem: Consults Goal: Birthing Suites Patient Information Press F2 to bring up selections list  Outcome: Completed/Met Date Met:  07/29/13  Pt < [redacted] weeks EGA, Inpatient induction, PIH (Pregnancy induced hypertension) and IUFD (Intrauterine fetal demise)

## 2013-07-29 NOTE — Progress Notes (Signed)
Chaplain paged at 2134 and arrived in unit at 2152.  [name] requested a chaplain to come after delivery to bless [name] who died prior to birth. 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 This rite was important to her grief process and supports of her spiritual beliefs.   Preliminary grief support and counsel was provided.  Recommend Daytime Chaplain follow up. At present [name] is still feeling the affects of her procedure and needs rest in order to better deal with the aftermath of the loss of her first birth child, Deborah French.  Benjie Karvonenharles D. [name], DMin Chaplain

## 2013-07-29 NOTE — Progress Notes (Addendum)
Patient ID: Deborah French, female   DOB: 16-Aug-1990, 23 y.o.   MRN: [id number] FACULTY PRACTICE ANTEPARTUM(COMPREHENSIVE) NOTE Deborah French is a 23 y.o. G1P0 at [redacted]w[redacted]d by midtrimester ultrasound who is admitted for seizure, eclampsia. By unofficial bedside US in Jan 23.5 weeks.   Fetal presentation is cephalic. Length of Stay:  1  Days  Subjective: headache better Patient reports the fetal movement as active. Patient reports uterine contraction  activity as none. Patient reports  vaginal bleeding as none. Patient describes fluid per vagina as None.  Vitals:  Blood pressure 135/76, pulse 105, temperature 98 F (36.7 C), temperature source Oral, resp. rate 16, height 5\' 7"  (1.702 m), weight 120 lb (54.432 kg), last menstrual period 02/16/2013, SpO2 97.00%. Physical Examination:  General appearance - alert, well appearing, and in no distress Heart - normal rate and regular rhythm Abdomen - soft, nontender, nondistended Fundal Height:  size equals dates Cervical Exam: Not evaluated.  Extremities: extremities normal, atraumatic, no cyanosis or edema and Homans sign is negative, no sign of DVT with DTRs 2+ bilaterally Membranes:intact  Fetal Monitoring:  Baseline: 130 bpm, Variability: Fair (1-6 bpm), Accelerations: Non-reactive but appropriate for gestational age and Decelerations: Absent  Labs:  Results for orders placed during the hospital encounter of 07/28/13 (from the past 24 hour(s))  URINALYSIS, ROUTINE W REFLEX MICROSCOPIC   Collection Time    07/28/13  9:20 AM      Result Value Ref Range   Color, Urine YELLOW  YELLOW   APPearance CLEAR  CLEAR   Specific Gravity, Urine 1.025  1.005 - 1.030   pH 6.0  5.0 - 8.0   Glucose, UA NEGATIVE  NEGATIVE mg/dL   Hgb urine dipstick SMALL (*) NEGATIVE   Bilirubin Urine NEGATIVE  NEGATIVE   Ketones, ur NEGATIVE  NEGATIVE mg/dL   Protein, ur 409 (*) NEGATIVE mg/dL   Urobilinogen, UA 0.2  0.0 - 1.0 mg/dL   Nitrite NEGATIVE  NEGATIVE    Leukocytes, UA NEGATIVE  NEGATIVE  PROTEIN / CREATININE RATIO, URINE   Collection Time    07/28/13  9:20 AM      Result Value Ref Range   Creatinine, Urine 51.92     Total Protein, Urine 346.5     PROTEIN CREATININE RATIO 6.67 (*) 0.00 - 0.15  URINE MICROSCOPIC-ADD ON   Collection Time    07/28/13  9:20 AM      Result Value Ref Range   Squamous Epithelial / LPF RARE  RARE   RBC / HPF 0-2  <3 RBC/hpf   Casts GRANULAR CAST (*) NEGATIVE  CBC   Collection Time    07/28/13  6:55 PM      Result Value Ref Range   WBC 17.3 (*) 4.0 - 10.5 K/uL   RBC 4.83  3.87 - 5.11 MIL/uL   Hemoglobin 11.0 (*) 12.0 - 15.0 g/dL   HCT 81.1 (*) 91.4 - 78.2 %   MCV 72.3 (*) 78.0 - 100.0 fL   MCH 22.8 (*) 26.0 - 34.0 pg   MCHC 31.5  30.0 - 36.0 g/dL   RDW 95.6 (*) 21.3 - 08.6 %   Platelets 285  150 - 400 K/uL  COMPREHENSIVE METABOLIC PANEL   Collection Time    07/28/13  6:55 PM      Result Value Ref Range   Sodium 135 (*) 137 - 147 mEq/L   Potassium 4.5  3.7 - 5.3 mEq/L   Chloride 99  96 - 112 mEq/L   CO2  20  19 - 32 mEq/L   Glucose, Bld 125 (*) 70 - 99 mg/dL   BUN 12  6 - 23 mg/dL   Creatinine, Ser 1.610.80  0.50 - 1.10 mg/dL   Calcium 8.9  8.4 - 09.610.5 mg/dL   Total Protein 6.8  6.0 - 8.3 g/dL   Albumin 3.1 (*) 3.5 - 5.2 g/dL   AST 37  0 - 37 U/L   ALT 19  0 - 35 U/L   Alkaline Phosphatase 126 (*) 39 - 117 U/L   Total Bilirubin 0.3  0.3 - 1.2 mg/dL   GFR calc non Af Amer >90  >90 mL/min   GFR calc Af Amer >90  >90 mL/min  MAGNESIUM   Collection Time    07/28/13  6:55 PM      Result Value Ref Range   Magnesium 7.7 (*) 1.5 - 2.5 mg/dL  TYPE AND SCREEN   Collection Time    07/28/13  6:55 PM      Result Value Ref Range   ABO/RH(D) O POS     Antibody Screen NEG     Sample Expiration 07/31/2013    ABO/RH   Collection Time    07/28/13  6:55 PM      Result Value Ref Range   ABO/RH(D) O POS    CBG MONITORING, ED   Collection Time    07/28/13  7:14 AM      Result Value Ref Range    Glucose-Capillary 95  70 - 99 mg/dL  TYPE AND SCREEN   Collection Time    07/28/13  7:34 AM      Result Value Ref Range   ABO/RH(D) O POS     Antibody Screen NEG     Sample Expiration 07/31/2013    COMPREHENSIVE METABOLIC PANEL   Collection Time    07/28/13  7:43 AM      Result Value Ref Range   Sodium 137  137 - 147 mEq/L   Potassium 4.4  3.7 - 5.3 mEq/L   Chloride 103  96 - 112 mEq/L   CO2 17 (*) 19 - 32 mEq/L   Glucose, Bld 87  70 - 99 mg/dL   BUN 11  6 - 23 mg/dL   Creatinine, Ser 0.450.84  0.50 - 1.10 mg/dL   Calcium 9.1  8.4 - 40.910.5 mg/dL   Total Protein 6.5  6.0 - 8.3 g/dL   Albumin 2.7 (*) 3.5 - 5.2 g/dL   AST 42 (*) 0 - 37 U/L   ALT 18  0 - 35 U/L   Alkaline Phosphatase 118 (*) 39 - 117 U/L   Total Bilirubin 0.2 (*) 0.3 - 1.2 mg/dL   GFR calc non Af Amer >90  >90 mL/min   GFR calc Af Amer >90  >90 mL/min  CBC WITH DIFFERENTIAL   Collection Time    07/28/13  7:43 AM      Result Value Ref Range   WBC 11.0 (*) 4.0 - 10.5 K/uL   RBC 4.18  3.87 - 5.11 MIL/uL   Hemoglobin 9.4 (*) 12.0 - 15.0 g/dL   HCT 81.130.7 (*) 91.436.0 - 78.246.0 %   MCV 73.4 (*) 78.0 - 100.0 fL   MCH 22.5 (*) 26.0 - 34.0 pg   MCHC 30.6  30.0 - 36.0 g/dL   RDW 95.618.2 (*) 21.311.5 - 08.615.5 %   Platelets 251  150 - 400 K/uL   Neutrophils Relative % 67  43 - 77 %  Neutro Abs 7.3  1.7 - 7.7 K/uL   Lymphocytes Relative 25  12 - 46 %   Lymphs Abs 2.7  0.7 - 4.0 K/uL   Monocytes Relative 4  3 - 12 %   Monocytes Absolute 0.5  0.1 - 1.0 K/uL   Eosinophils Relative 4  0 - 5 %   Eosinophils Absolute 0.4  0.0 - 0.7 K/uL   Basophils Relative 1  0 - 1 %   Basophils Absolute 0.1  0.0 - 0.1 K/uL  ACETAMINOPHEN LEVEL   Collection Time    07/28/13  7:43 AM      Result Value Ref Range   Acetaminophen (Tylenol), Serum <15.0  10 - 30 ug/mL  CK   Collection Time    07/28/13  7:43 AM      Result Value Ref Range   Total CK 166  7 - 177 U/L  ETHANOL   Collection Time    07/28/13  7:43 AM      Result Value Ref Range   Alcohol,  Ethyl (B) <11  0 - 11 mg/dL  SALICYLATE LEVEL   Collection Time    07/28/13  7:43 AM      Result Value Ref Range   Salicylate Lvl <2.0 (*) 2.8 - 20.0 mg/dL  HCG, QUANTITATIVE, PREGNANCY   Collection Time    07/28/13  7:43 AM      Result Value Ref Range   hCG, Beta Chain, Quant, S [id number] (*) <5 mIU/mL  I-STAT CG4 LACTIC ACID, ED   Collection Time    07/28/13  8:02 AM      Result Value Ref Range   Lactic Acid, Venous 5.49 (*) 0.5 - 2.2 mmol/L    Imaging Studies:      Currently EPIC will not allow sonographic studies to automatically populate into notes.  In the meantime, copy and paste results into note or free text.  Medications:  Scheduled . betamethasone acetate-betamethasone sodium phosphate  12 mg Intramuscular Q24H  . docusate sodium  100 mg Oral Daily  . hydrALAZINE  10 mg Intramuscular Once  . prenatal multivitamin  1 tablet Oral Q1200   I have reviewed the patient's current medications.  ASSESSMENT: Patient Active Problem List   Diagnosis Date Noted  . Eclampsia complicating pregnancy in second trimester 07/28/2013  . 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

## 2013-07-29 NOTE — Progress Notes (Signed)
Patient ID: Deborah French, female   DOB: [birth year], 23 y.o.   MRN: [id number] ACULTY PRACTICE ANTEPARTUM COMPREHENSIVE PROGRESS NOTE  Deborah French is a 23 y.o. G1P0 at 1380w1d  who is admitted for eclamptic seizure.  Pt was noted to fall while playing basketball.  She reports that she 'got up and continued playing'.  She had no further problems until later that evening when she awakened her partner with seizure activity.  Pt denies past history of HTN or prior seizure activity.  She is unsure of her LMP.        Length of Stay:  1  Days  Subjective: Pt denies complaints at present.  She reports that she felt some 'pain on her left side in the past' but, had not noted FM to date.  (she does report that since admission she has felt the baby move  She denies current abd pain or uterine contractions, no bleeding and no loss of fluid per vagina.  Vitals:  Blood pressure 136/85, pulse 113, temperature 98.3 F (36.8 C), temperature source Axillary, resp. rate 16, height 5\' 7"  (1.702 m), weight 120 lb (54.432 kg), last menstrual period 02/16/2013, SpO2 100.00%. Physical Examination: General appearance - alert, well appearing, and in no distress, oriented to person, place, and time and normal appearing weight Mental status - normal mood, behavior, speech, dress, motor activity, and thought processes, affect appropriate to mood Extremities - mild pedal edema noted.  Non pitting Cervical Exam: Not evaluated.  Membranes:intact  Fetal Monitoring: +FHR; toco: no contractions  Labs:  Results for orders placed during the hospital encounter of 07/28/13 (from the past 24 hour(s))  CBC   Collection Time    07/28/13  6:55 PM      Result Value Ref Range   WBC 17.3 (*) 4.0 - 10.5 K/uL   RBC 4.83  3.87 - 5.11 MIL/uL   Hemoglobin 11.0 (*) 12.0 - 15.0 g/dL   HCT 86.534.9 (*) 78.436.0 - 69.646.0 %   MCV 72.3 (*) 78.0 - 100.0 fL   MCH 22.8 (*) 26.0 - 34.0 pg   MCHC 31.5  30.0 - 36.0 g/dL   RDW 29.518.9 (*) 28.411.5 - 13.215.5 %    Platelets 285  150 - 400 K/uL  COMPREHENSIVE METABOLIC PANEL   Collection Time    07/28/13  6:55 PM      Result Value Ref Range   Sodium 135 (*) 137 - 147 mEq/L   Potassium 4.5  3.7 - 5.3 mEq/L   Chloride 99  96 - 112 mEq/L   CO2 20  19 - 32 mEq/L   Glucose, Bld 125 (*) 70 - 99 mg/dL   BUN 12  6 - 23 mg/dL   Creatinine, Ser 4.400.80  0.50 - 1.10 mg/dL   Calcium 8.9  8.4 - 10.210.5 mg/dL   Total Protein 6.8  6.0 - 8.3 g/dL   Albumin 3.1 (*) 3.5 - 5.2 g/dL   AST 37  0 - 37 U/L   ALT 19  0 - 35 U/L   Alkaline Phosphatase 126 (*) 39 - 117 U/L   Total Bilirubin 0.3  0.3 - 1.2 mg/dL   GFR calc non Af Amer >90  >90 mL/min   GFR calc Af Amer >90  >90 mL/min  MAGNESIUM   Collection Time    07/28/13  6:55 PM      Result Value Ref Range   Magnesium 7.7 (*) 1.5 - 2.5 mg/dL  TYPE AND SCREEN   Collection Time  07/28/13  6:55 PM      Result Value Ref Range   ABO/RH(D) O POS     Antibody Screen NEG     Sample Expiration 07/31/2013    ABO/RH   Collection Time    07/28/13  6:55 PM      Result Value Ref Range   ABO/RH(D) O POS      Imaging Studies:    See sono report   Medications:  Scheduled . docusate sodium  100 mg Oral Daily  . hydrALAZINE  10 mg Intramuscular Once  . prenatal multivitamin  1 tablet Oral Q1200   I have reviewed the patient's current medications.  ASSESSMENT: Patient Active Problem List   Diagnosis Date Noted  . Eclampsia complicating pregnancy in second trimester 07/28/2013  . 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  Pt's BP is elevated but, stable off BP meds.  Her GA composite is 22 1/7 weeks. Based on one measurement of the cerebellum form yesterday pt could be as far along as 23 4/7.  However, there were also measurement suggesting that she could be closer to [redacted]weeks gestation.   I spoke to the patient and her partner in detail about the limits of viability and the significance of peri vs pre  viability.  I explained to her that if she remains pregnant in all likelihood she would begin to get worse clinically.  I explained to her that delivery of the fetus would be the treatment of choice for her.  I also spoke to her about delivery options and that regardless of the mode of delivery, a viable fetus is not assured.  i explained to her and her partner that if she did have a c-section delivery for this pregnancy that ANY subsequent pregnancies would need to be delivered via cesarean due to risks  Of classical incision.  I did give her the option of conservative treatment to see if viability could be obtained by waiting 3 days.  HOWEVER, I explained to her that that was best case scenario regarding dates and that I could not in any way predict how sick she would become in the meantime.  Pts questions were answered.   NICU will consult with the patient and following that I will speak to her again to ascertain her decision regarding delivery.   Eber JonesCarolyn Harraway-Smith 07/29/2013,12:08 PM

## 2013-07-29 NOTE — Progress Notes (Signed)
Patient ID: Deborah French, female   DOB: Mar 27, 1991, 23 y.o.   MRN: [id number] I spoke with pt again with Dr. Francine Gravenimaguila (neonatology), Dr. Vincenza HewsQuinn (MFM) and her nurse.  After the full clinical picture was reviewed pt and her partner have decided to proceed with delivery.  The infant will be delivered vaginally and will be assessed at the time of delivery to ascertain whether resuscitation is an option.  All questions were answered.  Harveen Flesch L. Harraway-Smith, M.D., [facility]AC[facility]

## 2013-07-29 NOTE — Consult Note (Addendum)
MFM consult  23 yr old G1P0 with uncertain gestational age and no prenatal care who presented to the hospital yesterday after having a seizure. Patient had fallen earlier that day and hit her head.  No medical or surgical history per chart. Per primary OB no history of seizures or hypertension.  O: BPs 118-161/71-102; urine output has been adequate but decreased  Ultrasound yesterday puts overall fetal biometry at 25110w6d but cerebellum measures 23 weeks  Labs: normal platelets and LFTs Urine protein/creatinine ratio is elevated at 6.67  Discussed with Dr. Dolan AmenHarroway-Smith that the picture is not completely clear given no prenatal care and that the patient had a fall yesterday. However given the elevated blood pressures (when patient reports no history of hypertension; although it is uncertain how much medical care she has received- had a normal BP in the ER 03/2013 and 04/2013- so chronic hypertension does not seem likely) and proteinuria and seizure activity (again with no history) this picture is consistent with eclampsia and delivery is recommended.  Recommend that it be presented to the patient that diagnosis is not 100% certain and she has not had prenatal care but given the above is most consistent with eclampsia and continuing pregnancy would pose risk to her health (renal failure, further seizure activity, bleeding in the brain, liver abnormalities, platelet abnormalities, bleeding, and even death).  Discussed that gestational age is uncertain. Patient appears to be perviable but it is unclear exact dating. LMP does not match ultrasound data. Patient was seen in the ER 04/2013 and a crown rump length at that time was done bedside (no official ultrasound) which would place patient at 1036w4d today; ultrasound done yesterday has biometry at 36110w6d, but cerebellum at 23w. Dating is uncertain given cannot verify accuracy of crown rump length done in first trimester and although ultrasound done yesterday  shows biometry at 76110w6d fetus may have been growth restricted due to eclampsia. Cerebellum is more consistent with 23 weeks.   Patient should be told she is periviable and survival is uncertain. She should have a consult with NICU to discuss if she wants resuscitation or C section for distress/malpresentation. Patient should understand survival estimates are only estimated given uncertainty of dating and that overall survival rates are low given fetal size and periviable gestational age.  In summary, with available data this picture appears consistent with eclampsia and therefore would recommend delivery given above risks to maternal health. The above uncertainties should be explained to the patient (which we are happy to do if desired). NICU should be consulted to help aid in decision making.  Recommend continue magnesium sulfate through 24 hours post delivery. Recommend trend labs. Recommend antiphospholipid antibody work up with lupus anticoagulant, anticardiolipin antibodies, and beta 2 glycoprotein. Recommend urine drug screen if not already performed. Recommend treat persistent severe range blood pressures with either labetalol, procardia, or hydralazine. Recommend monitor urine output closely.  I spoke with the patient and relayed the above risks. The patient had no further questions. I spent a total of 30 minutes on the case of which >50% was spent in face to face consultation.  Eulis FosterKristen Lenzie Montesano, MD

## 2013-07-30 ENCOUNTER — Encounter: Payer: Self-pay | Admitting: Obstetrics & Gynecology

## 2013-07-30 LAB — CBC
HCT: 28.8 % — ABNORMAL LOW (ref 36.0–46.0)
HCT: 30.9 % — ABNORMAL LOW (ref 36.0–46.0)
Hemoglobin: 8.9 g/dL — ABNORMAL LOW (ref 12.0–15.0)
Hemoglobin: 9.6 g/dL — ABNORMAL LOW (ref 12.0–15.0)
MCH: 22.5 pg — ABNORMAL LOW (ref 26.0–34.0)
MCH: 22.8 pg — AB (ref 26.0–34.0)
MCHC: 30.9 g/dL (ref 30.0–36.0)
MCHC: 31.1 g/dL (ref 30.0–36.0)
MCV: 72.9 fL — ABNORMAL LOW (ref 78.0–100.0)
MCV: 73.4 fL — AB (ref 78.0–100.0)
PLATELETS: 253 10*3/uL (ref 150–400)
Platelets: 243 10*3/uL (ref 150–400)
RBC: 3.95 MIL/uL (ref 3.87–5.11)
RBC: 4.21 MIL/uL (ref 3.87–5.11)
RDW: 19.6 % — ABNORMAL HIGH (ref 11.5–15.5)
RDW: 19.8 % — AB (ref 11.5–15.5)
WBC: 21 10*3/uL — ABNORMAL HIGH (ref 4.0–10.5)
WBC: 21.3 10*3/uL — AB (ref 4.0–10.5)

## 2013-07-30 LAB — DRUGS OF ABUSE SCREEN W/O ALC, ROUTINE URINE
Amphetamine Screen, Ur: NEGATIVE
Barbiturate Quant, Ur: NEGATIVE
Benzodiazepines.: POSITIVE — AB
COCAINE METABOLITES: NEGATIVE
CREATININE, U: 143 mg/dL
Marijuana Metabolite: NEGATIVE
Methadone: NEGATIVE
OPIATE SCREEN, URINE: NEGATIVE
PROPOXYPHENE: NEGATIVE
Phencyclidine (PCP): NEGATIVE

## 2013-07-30 LAB — COMPREHENSIVE METABOLIC PANEL WITH GFR
ALT: 18 U/L (ref 0–35)
AST: 33 U/L (ref 0–37)
Albumin: 2.7 g/dL — ABNORMAL LOW (ref 3.5–5.2)
Alkaline Phosphatase: 98 U/L (ref 39–117)
BUN: 13 mg/dL (ref 6–23)
CO2: 24 meq/L (ref 19–32)
Calcium: 7.4 mg/dL — ABNORMAL LOW (ref 8.4–10.5)
Chloride: 103 meq/L (ref 96–112)
Creatinine, Ser: 0.83 mg/dL (ref 0.50–1.10)
GFR calc Af Amer: >90 mL/min
GFR calc non Af Amer: >90 mL/min
Glucose, Bld: 105 mg/dL — ABNORMAL HIGH (ref 70–99)
Potassium: 4.9 meq/L (ref 3.7–5.3)
Sodium: 138 meq/L (ref 137–147)
Total Bilirubin: <0.2 mg/dL — ABNORMAL LOW (ref 0.3–1.2)
Total Protein: 5.8 g/dL — ABNORMAL LOW (ref 6.0–8.3)

## 2013-07-30 LAB — COMPREHENSIVE METABOLIC PANEL
ALBUMIN: 2.6 g/dL — AB (ref 3.5–5.2)
ALK PHOS: 99 U/L (ref 39–117)
ALT: 18 U/L (ref 0–35)
AST: 35 U/L (ref 0–37)
BUN: 14 mg/dL (ref 6–23)
CHLORIDE: 102 meq/L (ref 96–112)
CO2: 21 mEq/L (ref 19–32)
Calcium: 7.5 mg/dL — ABNORMAL LOW (ref 8.4–10.5)
Creatinine, Ser: 0.86 mg/dL (ref 0.50–1.10)
GFR calc Af Amer: >90 mL/min (ref >90)
GFR calc non Af Amer: >90 mL/min (ref >90)
Glucose, Bld: 157 mg/dL — ABNORMAL HIGH (ref 70–99)
POTASSIUM: 4.5 meq/L (ref 3.7–5.3)
SODIUM: 138 meq/L (ref 137–147)
TOTAL PROTEIN: 5.9 g/dL — AB (ref 6.0–8.3)

## 2013-07-30 LAB — MRSA PCR SCREENING: MRSA by PCR: NEGATIVE

## 2013-07-30 LAB — HIV ANTIBODY (ROUTINE TESTING W REFLEX): HIV 1&2 Ab, 4th Generation: NONREACTIVE

## 2013-07-30 MED ORDER — DIPHENHYDRAMINE HCL 25 MG PO CAPS: 25.0000 mg | ORAL_CAPSULE | Freq: Four times a day (QID) | ORAL | Status: DC | PRN

## 2013-07-30 MED ORDER — ZOLPIDEM TARTRATE 5 MG PO TABS: 5.0000 mg | ORAL_TABLET | Freq: Every evening | ORAL | Status: DC | PRN

## 2013-07-30 MED ORDER — TERBUTALINE SULFATE 1 MG/ML IJ SOLN: 0.2500 mg | Freq: Once | INTRAMUSCULAR | Status: DC | PRN

## 2013-07-30 MED ORDER — MISOPROSTOL 200 MCG PO TABS: 50.0000 ug | ORAL_TABLET | ORAL | Status: DC | PRN

## 2013-07-30 MED ORDER — PRENATAL MULTIVITAMIN CH
1.0000 | ORAL_TABLET | Freq: Every day | ORAL | Status: DC
  Administered 2013-07-30 – 2013-07-31 (×2): 1 via ORAL
  Filled 2013-07-30 (×2): qty 1

## 2013-07-30 MED ORDER — HYDROCHLOROTHIAZIDE 25 MG PO TABS
25.0000 mg | ORAL_TABLET | Freq: Every day | ORAL | Status: DC
  Administered 2013-07-30 – 2013-07-31 (×2): 25 mg via ORAL
  Filled 2013-07-30 (×3): qty 1

## 2013-07-30 MED ORDER — OXYCODONE-ACETAMINOPHEN 5-325 MG PO TABS: 1.0000 | ORAL_TABLET | ORAL | Status: DC | PRN

## 2013-07-30 MED ORDER — IBUPROFEN 600 MG PO TABS
600.0000 mg | ORAL_TABLET | Freq: Four times a day (QID) | ORAL | Status: DC
  Administered 2013-07-30 – 2013-07-31 (×7): 600 mg via ORAL
  Filled 2013-07-30 (×7): qty 1

## 2013-07-30 MED ORDER — SIMETHICONE 80 MG PO CHEW: 80.0000 mg | CHEWABLE_TABLET | ORAL | Status: DC | PRN

## 2013-07-30 MED ORDER — ONDANSETRON HCL 4 MG PO TABS: 4.0000 mg | ORAL_TABLET | ORAL | Status: DC | PRN

## 2013-07-30 MED ORDER — LACTATED RINGERS IV SOLN
INTRAVENOUS | Status: DC
  Administered 2013-07-30 – 2013-07-31 (×3): via INTRAVENOUS

## 2013-07-30 MED ORDER — SENNOSIDES-DOCUSATE SODIUM 8.6-50 MG PO TABS
2.0000 | ORAL_TABLET | ORAL | Status: DC
  Administered 2013-07-31: 2 via ORAL
  Filled 2013-07-30: qty 2

## 2013-07-30 MED ORDER — WITCH HAZEL-GLYCERIN EX PADS: 1.0000 "application " | MEDICATED_PAD | CUTANEOUS | Status: DC | PRN

## 2013-07-30 MED ORDER — DIBUCAINE 1 % RE OINT: 1.0000 "application " | TOPICAL_OINTMENT | RECTAL | Status: DC | PRN

## 2013-07-30 MED ORDER — ONDANSETRON HCL 4 MG/2ML IJ SOLN: 4.0000 mg | INTRAMUSCULAR | Status: DC | PRN

## 2013-07-30 MED ORDER — LISINOPRIL 10 MG PO TABS
10.0000 mg | ORAL_TABLET | Freq: Every day | ORAL | Status: DC
  Administered 2013-07-30: 10 mg via ORAL
  Filled 2013-07-30 (×2): qty 1

## 2013-07-30 MED ORDER — TETANUS-DIPHTH-ACELL PERTUSSIS 5-2.5-18.5 LF-MCG/0.5 IM SUSP
0.5000 mL | Freq: Once | INTRAMUSCULAR | Status: DC
  Filled 2013-07-30: qty 0.5

## 2013-07-30 MED ORDER — LANOLIN HYDROUS EX OINT: TOPICAL_OINTMENT | CUTANEOUS | Status: DC | PRN

## 2013-07-30 MED ORDER — BENZOCAINE-MENTHOL 20-0.5 % EX AERO: 1.0000 "application " | INHALATION_SPRAY | CUTANEOUS | Status: DC | PRN

## 2013-07-30 NOTE — Progress Notes (Signed)
Pt and boyfriend (bedside) were resting when I arrived. Pt responded to prompts, but said she was ok. Her boyfriend said it had been a rough weekend. Pt said they were both just tired. Pt said it was also difficult discussing final arrangements. When asked how she is doing presently, she said much better. I encouraged pt and boyfriend to ask for any additional support they need even after discharge. They were appreciative for the visit and the suggestion. Marjory LiesPamela Carrington Holder Chaplain

## 2013-07-30 NOTE — Progress Notes (Signed)
Post Partum Day 1 Subjective: no complaints and denies visual changes or HA.  no sob. minimal bleeding  Objective: Blood pressure 131/77, pulse 88, temperature 98.2 F (36.8 C), temperature source Oral, resp. rate 18, height 5\' 7"  (1.702 m), weight 133 lb 6.4 oz (60.51 kg), last menstrual period 02/16/2013, SpO2 100.00%.  Physical Exam:  General: no distress and sleeping (easy to arouse) Lochia: appropriate Uterine Fundus: firm DVT Evaluation: No evidence of DVT seen on physical exam.   Recent Labs  07/30/13 0030 07/30/13 0510  HGB 9.6* 8.9*  HCT 30.9* 28.8*    Assessment/Plan: Keep Magnesium sulfate for 24 hours minimum Discharge to home after Magnesium once stable.    LOS: 2 days   Willodean RosenthalCarolyn Harraway-Smith 07/30/2013, 7:58 AM

## 2013-07-30 NOTE — Progress Notes (Signed)
UR chart review completed.  

## 2013-07-30 NOTE — Progress Notes (Signed)
Sitting up in bed - BP cuff upside down - reapplied and BP retaken at 2003

## 2013-07-30 NOTE — Anesthesia Postprocedure Evaluation (Signed)
  Anesthesia Post-op Note  Anesthesia Post Note  Patient: Deborah French  Procedure(s) Performed: * No procedures listed *  Anesthesia type: Epidural  Patient location:AICU  Post pain: Pain level controlled  Post assessment: Post-op Vital signs reviewed  Last Vitals:  Filed Vitals:   07/30/13 0700  BP: 131/77  Pulse: 88  Temp:   Resp:     Post vital signs: Reviewed  Level of consciousness:alert  Complications: No apparent anesthesia complications

## 2013-07-31 LAB — GC/CHLAMYDIA PROBE AMP
CT Probe RNA: NEGATIVE
GC Probe RNA: NEGATIVE

## 2013-07-31 LAB — RUBELLA ANTIBODY, IGM: Rubella IgM: 0.52 (ref <0.90)

## 2013-07-31 MED ORDER — LISINOPRIL 20 MG PO TABS
20.0000 mg | ORAL_TABLET | Freq: Every day | ORAL | Status: DC
Start: 2013-07-31 — End: 2013-08-05

## 2013-07-31 MED ORDER — LISINOPRIL 20 MG PO TABS
20.0000 mg | ORAL_TABLET | Freq: Every day | ORAL | Status: DC
  Administered 2013-07-31: 20 mg via ORAL
  Filled 2013-07-31 (×2): qty 1

## 2013-07-31 MED ORDER — AMLODIPINE BESYLATE 10 MG PO TABS
10.0000 mg | ORAL_TABLET | Freq: Every day | ORAL | Status: DC
Start: 2013-07-31 — End: 2013-07-31
  Administered 2013-07-31: 10 mg via ORAL
  Filled 2013-07-31 (×2): qty 1

## 2013-07-31 MED ORDER — AMLODIPINE BESYLATE 10 MG PO TABS: 10.0000 mg | ORAL_TABLET | Freq: Every day | ORAL | Status: DC

## 2013-07-31 MED ORDER — SENNOSIDES-DOCUSATE SODIUM 8.6-50 MG PO TABS: 2.0000 | ORAL_TABLET | ORAL | Status: DC

## 2013-07-31 MED ORDER — HYDROCHLOROTHIAZIDE 25 MG PO TABS: 25.0000 mg | ORAL_TABLET | Freq: Every day | ORAL | Status: DC

## 2013-07-31 MED ORDER — IBUPROFEN 600 MG PO TABS: 600.0000 mg | ORAL_TABLET | Freq: Four times a day (QID) | ORAL | Status: DC

## 2013-07-31 NOTE — Progress Notes (Signed)
I spent time with Deborah French and Deborah French several times today.  They were in deep grief as some of the shock is beginning lift.  They received information about Heartstrings and about how to receive additional chaplain support.  They also requested to see their daughter, Deborah French, again today which we were able to arrange.  Deborah French has a strong emotional need to be able to accompany his daughter for as long as he is able.  [facility] and will plan to go there in the morning (around 10:00) to sign papers.  At this point, they would then like to come back to the hospital and spend some more time with Deborah French in the Comfort Room on the 1st floor and be able to give her to Deborah French themselves.  I have notified the [facility] of this hope and Chaplain Rush BarerLisa Lundeen who will be here tomorrow.  We will work as a team to help provide support to the family in this transition.    [employer] CorporationChaplain Katy Kris No Pager, [phone] 4:16 PM

## 2013-07-31 NOTE — Progress Notes (Signed)
Post Partum Day 2 Subjective: No complaints currently.  She states that she is feeling well.  She denies headache, chest pain, SOB, vision changes. Voiding well, Up ad lib. No BM yet.  Objective: Blood pressure 158/100, pulse 88, temperature 98.4 F (36.9 C), temperature source Oral, resp. rate 16, height 5\' 7"  (1.702 m), weight 60.51 kg (133 lb 6.4 oz), last menstrual period 02/16/2013, SpO2 100.00%.  Physical Exam:  General: well appearing; resting in bed; NAD.  Lochia: appropriate Uterine Fundus: firm DVT Evaluation: No evidence of DVT seen on physical exam.   Recent Labs  07/30/13 0030 07/30/13 0510  HGB 9.6* 8.9*  HCT 30.9* 28.8*    Assessment/Plan: A: 23 year old G1P0100 PPD # 2 - NSVD of non-viable infant at [redacted] weeks gestation.       Eclampsia  P: Magnesium discontinued this am.  Pressures still elevated and labile.  Lisinopril increased to 20 mg daily.  Will continue HCTZ 25 mg daily.  Also adding Norvasc 10 mg daily. Will monitor BP closely.  If improve/stabilize she may be able to go home later this afternoon.     LOS: 3 days   Tommie SamsJayce G Mekenna Finau 07/31/2013, 7:42 AM

## 2013-07-31 NOTE — Progress Notes (Signed)
CSW consult noted.  [name] is involved with pt & providing adequate support.  CSW contacted the Financial Counselor, Deborah French to request assistance with Medicaid services.  CSW will not meet with the pt at this time since appears her needs are being addressed.  CSW signing off.  Please reconsult if needed.

## 2013-07-31 NOTE — Discharge Instructions (Signed)
Please be sure to follow up for a BP check Friday or Monday.  Preeclampsia and Eclampsia Preeclampsia is a condition of high blood pressure during pregnancy. It can happen at 20 weeks or later in pregnancy. If high blood pressure occurs in the second half of pregnancy with no other symptoms, it is called gestational hypertension and goes away after the baby is born. If any of the symptoms listed below develop with gestational hypertension, it is then called preeclampsia. Eclampsia (convulsions) may follow preeclampsia. This is one of the reasons for regular prenatal checkups. Early diagnosis and treatment are very important to prevent eclampsia. CAUSES  There is no known cause of preeclampsia/eclampsia in pregnancy. There are several known conditions that may put the pregnant woman at risk, such as:  The first pregnancy.  Having preeclampsia in a past pregnancy.  Having lasting (chronic) high blood pressure.  Having multiples (twins, triplets).  Being age 25 or older.  African American ethnic background.  Having kidney disease or diabetes.  Medical conditions such as lupus or blood diseases.  Being overweight (obese). SYMPTOMS   High blood pressure.  Headaches.  Sudden weight gain.  Swelling of hands, face, legs, and feet.  Protein in the urine.  Feeling sick to your stomach (nauseous) and throwing up (vomiting).  Vision problems (blurred or double vision).  Numbness in the face, arms, legs, and feet.  Dizziness.  Slurred speech.  Preeclampsia can cause growth retardation in the fetus.  Separation (abruption) of the placenta.  Not enough fluid in the amniotic sac (oligohydramnios).  Sensitivity to bright lights.  Belly (abdominal) pain. DIAGNOSIS  If protein is found in the urine in the second half of pregnancy, this is considered preeclampsia. Other symptoms mentioned above may also be present. TREATMENT  It is necessary to treat this.  Your caregiver may  prescribe bed rest early in this condition. Plenty of rest and salt restriction may be all that is needed.  Medicines may be necessary to lower blood pressure if the condition does not respond to more conservative measures.  In more severe cases, hospitalization may be needed:  For treatment of blood pressure.  To control fluid retention.  To monitor the baby to see if the condition is causing harm to the baby.  Hospitalization is the best way to treat the first sign of preeclampsia. This is so the mother and baby can be watched closely and blood tests can be done effectively and correctly.  If the condition becomes severe, it may be necessary to induce labor or to remove the infant by surgical means (cesarean section). The best cure for preeclampsia/eclampsia is to deliver the baby. Preeclampsia and eclampsia involve risks to mother and infant. Your caregiver will discuss these risks with you. Together, you can work out the best possible approach to your problems. Make sure you keep your prenatal visits as scheduled. Not keeping appointments could result in a chronic or permanent injury, pain, disability to you, and death or injury to you or your unborn baby. If there is any problem keeping the appointment, you must call to reschedule. HOME CARE INSTRUCTIONS   Keep your prenatal appointments and tests as scheduled.  Tell your caregiver if you have any of the above risk factors.  Get plenty of rest and sleep.  Eat a balanced diet that is low in salt, and do not add salt to your food.  Avoid stressful situations.  Only take over-the-counter and prescriptions medicines for pain, discomfort, or fever as directed  by your caregiver. SEEK IMMEDIATE MEDICAL CARE IF:   You develop severe swelling anywhere in the body. This usually occurs in the legs.  You gain 05 lb/2.3 kg or more in a week.  You develop a severe headache, dizziness, problems with your vision, or confusion.  You have  abdominal pain, nausea, or vomiting.  You have a seizure.  You have trouble moving any part of your body, or you develop numbness or problems speaking.  You have bruising or abnormal bleeding from anywhere in the body.  You develop a stiff neck.  You pass out. MAKE SURE YOU:   Understand these instructions.  Will watch your condition.  Will get help right away if you are not doing well or get worse. Document Released: 04/01/2000 Document Revised: 06/27/2011 Document Reviewed: 11/16/2007 [facility] Patient Information 2014 [employer] [place].

## 2013-07-31 NOTE — Progress Notes (Signed)
I have seen and examined this patient and I agree with the above. Arabella MerlesKimberly D Yocelyn Brocious 8:02 AM 07/31/2013

## 2013-07-31 NOTE — Discharge Summary (Signed)
Obstetric Discharge Summary Reason for Admission: eclamptic seizures Prenatal Procedures: ultrasound and MFM and NICU consult Intrapartum Procedures: spontaneous vaginal delivery Postpartum Procedures: none Complications-Operative and Postpartum: IUFD @[redacted]w[redacted]d .   Hemoglobin  Date Value Ref Range Status  07/30/2013 8.9* 12.0 - 15.0 g/dL Final     HCT  Date Value Ref Range Status  07/30/2013 28.8* 36.0 - 46.0 % Final   Hospital Course: Pt is a 23 yo G1 with no PNC who [facility] actively seizing.  On US it seemed that the pt was approx 4654w1d gestation. [facility].  MFM and NICU were both consulted and after discussion with the patient, a decision was made to proceed with delivery.  She was induced with cytotec and delivered via NSVD a nonviable fetus that weighed 13oz.  She was kept on magnesium postpartum and continued to have severely elevated BP. She was started on HCTZ 25mg , lisinopril 20mg , and norvasc 10mg  with BP then remaining in the 130s-140s/90s-100s.  She was asymptomatic. Her magnesium was stopped after 36 hours and she was monitored for 12 hours after with no symptoms and no increase in BP.  Pt was requesting at that time to be discharged. She was discharged to home in stable condition with f/u on Monday in clinic for BP check and f/u in 4 weeks for PP visit. She is undecided about birth control at this time.     Physical Exam:  General: alert, cooperative and no distress Lochia: appropriate Uterine Fundus: firm Incision: na DVT Evaluation: No cords or calf tenderness. No significant calf/ankle edema.  Discharge Diagnoses: Eclampsia, s/p delivery of a nonviable 22wk fetus  Discharge Information: Date: 07/31/2013 Activity: pelvic rest Diet: routine Medications: PNV, Ibuprofen and lisinopril 20mg , HCTZ 25mg  and Norvasc 10mg  Condition: stable Instructions: refer to practice specific booklet Discharge to: home Follow-up  Information   [facility]. Call in 4 weeks.   Specialty:  Obstetrics and Gynecology   Contact information:   [address] [place] [address] [phone]      [facility]. (come in on Monday for a blood pressure check at your convenience)    Specialty:  Obstetrics and Gynecology   Contact information:   [address] [place] [address] [phone]      Newborn Data: Live born female  Birth Weight: 13 oz (369 g) APGAR: 0, 0  To Morgue.       Ludell Zacarias L Elyssa Pendelton 07/31/2013, 1:46 PM

## 2013-08-01 ENCOUNTER — Encounter: Payer: Self-pay | Admitting: Obstetrics & Gynecology

## 2013-08-01 DIAGNOSIS — O9989 Other specified diseases and conditions complicating pregnancy, childbirth and the puerperium: Secondary | ICD-10-CM

## 2013-08-01 DIAGNOSIS — Z283 Underimmunization status: Secondary | ICD-10-CM | POA: Insufficient documentation

## 2013-08-01 DIAGNOSIS — Z2839 Other underimmunization status: Secondary | ICD-10-CM | POA: Insufficient documentation

## 2013-08-01 DIAGNOSIS — O99892 Other specified diseases and conditions complicating childbirth: Secondary | ICD-10-CM | POA: Insufficient documentation

## 2013-08-01 LAB — BENZODIAZEPINE, QUANTITATIVE, URINE
Alprazolam metabolite (GC/LC/MS), ur confirm: NEGATIVE ng/mL
CLONAZEPAU: NEGATIVE ng/mL
Diazepam (GC/LC/MS), ur confirm: NEGATIVE ng/mL
Estazolam (GC/LC/MS), ur confirm: NEGATIVE ng/mL
FLUNITRAZEPU: NEGATIVE ng/mL
Flurazepam GC/MS Conf: NEGATIVE ng/mL
HYDROXYALPRAZ UR: NEGATIVE ng/mL
Lorazepam (GC/LC/MS), ur confirm: NEGATIVE ng/mL
MIDAZOLAMU: 458 ng/mL
Nordiazepam GC/MS Conf: NEGATIVE ng/mL
Oxazepam GC/MS Conf: NEGATIVE ng/mL
TRIAZOLAMU: NEGATIVE ng/mL
Temazepam GC/MS Conf: NEGATIVE ng/mL

## 2013-08-05 ENCOUNTER — Ambulatory Visit (INDEPENDENT_AMBULATORY_CARE_PROVIDER_SITE_OTHER): Payer: Self-pay | Admitting: *Deleted

## 2013-08-05 VITALS — BP 144/95 | HR 117 | Wt 114.1 lb

## 2013-08-05 DIAGNOSIS — R030 Elevated blood-pressure reading, without diagnosis of hypertension: Secondary | ICD-10-CM

## 2013-08-05 DIAGNOSIS — IMO0001 Reserved for inherently not codable concepts without codable children: Secondary | ICD-10-CM

## 2013-08-05 MED ORDER — LISINOPRIL 20 MG PO TABS: 40.0000 mg | ORAL_TABLET | Freq: Every day | ORAL | Status: DC

## 2013-08-05 NOTE — Progress Notes (Signed)
Blood pressures elevated discussed with Dr. Ike Benedom, medication dosage changed.  Instructed patient in change in medication dosage and to return in 1 wk for blood pressure check.  Instructed to come to clinic or MAU if lightheaded.  Pt verbalizes understanding.

## 2013-08-07 ENCOUNTER — Telehealth: Payer: Self-pay | Admitting: *Deleted

## 2013-08-07 NOTE — Telephone Encounter (Signed)
Pt called nurse line requesting refill of medication.  No other information provided.  Attempted to contact patient, no answer, unable to leave a message.

## 2013-08-08 ENCOUNTER — Encounter: Payer: Self-pay | Admitting: *Deleted

## 2013-08-08 NOTE — Telephone Encounter (Signed)
Attempted to call patient, no answer, unable to leave message.  Will send letter,   Letter sent.

## 2013-08-12 ENCOUNTER — Ambulatory Visit: Payer: Medicaid Other

## 2013-08-12 VITALS — BP 116/82 | HR 112 | Wt 114.6 lb

## 2013-08-12 DIAGNOSIS — Z0130 Encounter for examination of blood pressure without abnormal findings: Secondary | ICD-10-CM

## 2013-08-12 NOTE — Progress Notes (Signed)
Pt came in for BP check due dx of ecclampsia.  She delivered on 07/29/13 via vaginal.  BP stable.  Consulted with Dr. Penne LashLeggett and was told to have pt come back in 2 weeks for rpt BP check to make sure that BP medication does not need to be changed.  Pt agreed.

## 2013-08-14 ENCOUNTER — Emergency Department (HOSPITAL_COMMUNITY)
Admission: EM | Admit: 2013-08-14 | Discharge: 2013-08-14 | Disposition: A | Discharge status: Home / Self Care | Payer: Medicaid Other | Attending: Emergency Medicine | Admitting: Emergency Medicine

## 2013-08-14 ENCOUNTER — Encounter (HOSPITAL_COMMUNITY): Payer: Self-pay | Admitting: Emergency Medicine

## 2013-08-14 DIAGNOSIS — K044 Acute apical periodontitis of pulpal origin: Secondary | ICD-10-CM | POA: Insufficient documentation

## 2013-08-14 DIAGNOSIS — G479 Sleep disorder, unspecified: Secondary | ICD-10-CM | POA: Insufficient documentation

## 2013-08-14 DIAGNOSIS — Z79899 Other long term (current) drug therapy: Secondary | ICD-10-CM | POA: Insufficient documentation

## 2013-08-14 DIAGNOSIS — K002 Abnormalities of size and form of teeth: Secondary | ICD-10-CM | POA: Insufficient documentation

## 2013-08-14 DIAGNOSIS — K047 Periapical abscess without sinus: Secondary | ICD-10-CM

## 2013-08-14 DIAGNOSIS — K0889 Other specified disorders of teeth and supporting structures: Secondary | ICD-10-CM

## 2013-08-14 MED ORDER — HYDROCODONE-ACETAMINOPHEN 5-325 MG PO TABS: 1.0000 | ORAL_TABLET | ORAL | Status: DC | PRN

## 2013-08-14 MED ORDER — AMOXICILLIN 500 MG PO CAPS: 500.0000 mg | ORAL_CAPSULE | Freq: Three times a day (TID) | ORAL | Status: DC

## 2013-08-14 NOTE — ED Notes (Signed)
Pt c/o dental pain and swollen gums on R side for about a week. Pt states pain started when she started her BP medication. Pt states she is not taking BP med now. Pt alert, no acute distress.

## 2013-08-14 NOTE — ED Provider Notes (Signed)
CSN: [id number]     Arrival date & time 08/14/13  1716 History   This chart was scribed for non-physician practitioner, Johnnette Gourdobyn Albert, working with Celene KrasJon R Knapp, MD, by Tana ConchStephen Methvin ED Scribe. This patient was seen in WTR[facility] and the patient's care was started at 5:25 PM.    Chief Complaint  Patient presents with  . Dental Pain      The history is provided by the patient. No language interpreter was used.    HPI Comments: Deborah French is a 23 y.o. female who presents to the Emergency Department complaining of right upper dental pain that has worsened over the past week, it is worsened by eating or drinking anything hot or cold. The pain keeps her up all night and she states that NSAIDS only work for around 1 hour. Denies fever, chills or difficulty swallowing.  Pt does not have a dentist.   History reviewed. No pertinent past medical history. History reviewed. No pertinent past surgical history. No family history on file. History  Substance Use Topics  . Smoking status: Never Smoker   . Smokeless tobacco: Never Used  . Alcohol Use: No   OB History   Grav Para Term Preterm Abortions TAB SAB Ect Mult Living   1 1  1            Review of Systems  HENT: Positive for dental problem.   Psychiatric/Behavioral: Positive for sleep disturbance.  All other systems reviewed and are negative.     Allergies  Review of patient's allergies indicates no known allergies.  Home Medications   Prior to Admission medications   Medication Sig Start Date End Date Taking? Authorizing Provider  acetaminophen (TYLENOL) 500 MG tablet Take 500-1,000 mg by mouth every 4 (four) hours as needed for mild pain.    Historical Provider, MD  amLODipine (NORVASC) 10 MG tablet Take 1 tablet (10 mg total) by mouth daily. 07/31/13   Vale HavenKeli L Beck, MD  hydrochlorothiazide (HYDRODIURIL) 25 MG tablet Take 1 tablet (25 mg total) by mouth daily. 07/31/13   Tommie SamsJayce G Cook, DO  ibuprofen (ADVIL,MOTRIN) 600 MG  tablet Take 1 tablet (600 mg total) by mouth every 6 (six) hours. 07/31/13   Tommie SamsJayce G Cook, DO  lisinopril (PRINIVIL,ZESTRIL) 20 MG tablet Take 2 tablets (40 mg total) by mouth daily. 08/05/13   Minta BalsamMichael R Odom, MD  Prenatal Vit-Fe Fumarate-FA (PRENATAL MULTIVITAMIN) TABS tablet Take 1 tablet by mouth daily at 12 noon.    Historical Provider, MD  senna-docusate (SENOKOT-S) 8.6-50 MG per tablet Take 2 tablets by mouth daily. 07/31/13   Tommie SamsJayce G Cook, DO   LMP 02/16/2013 Physical Exam  Nursing note and vitals reviewed. Constitutional: She is oriented to person, place, and time. She appears well-developed and well-nourished. No distress.  HENT:  Head: Normocephalic and atraumatic.  Mouth/Throat: Uvula is midline and oropharynx is clear and moist. No dental abscesses.    Poor dentition. Swallows secretions well.  Eyes: Conjunctivae and EOM are normal.  Neck: Normal range of motion. Neck supple.  Cardiovascular: Normal rate, regular rhythm and normal heart sounds.   Pulmonary/Chest: Effort normal and breath sounds normal. No respiratory distress.  Musculoskeletal: Normal range of motion. She exhibits no edema.  Lymphadenopathy:       Head (right side): No submandibular adenopathy present.       Head (left side): No submandibular adenopathy present.    She has no cervical adenopathy.  Neurological: She is alert and oriented to person, place, and  time. No sensory deficit.  Skin: Skin is warm and dry.  Psychiatric: She has a normal mood and affect. Her behavior is normal.    ED Course  Procedures (including critical care time)  DIAGNOSTIC STUDIES: Oxygen Saturation is 100% on RA, normal by my interpretation.    COORDINATION OF CARE:   5:29 PM-Discussed treatment plan which includes pain medication and establishing a dentist with pt at bedside and pt agreed to plan.   Labs Review Labs Reviewed - No data to display  Imaging Review No results found.   EKG Interpretation None       MDM   Final diagnoses:  Pain, dental  Dental infection     Dental pain associated with dental infection. No evidence of dental abscess. Patient is afebrile, non toxic appearing and swallowing secretions well. I gave patient referral to dentist and stressed the importance of dental follow up for ultimate management of dental pain. I will also give amoxicillin and pain control. Patient voices understanding and is agreeable to plan.  I personally performed the services described in this documentation, which was scribed in my presence. The recorded information has been reviewed and is accurate.   Trevor MaceRobyn M Albert, PA-C 08/14/13 1745

## 2013-08-14 NOTE — Discharge Instructions (Signed)
Take antibiotic to completion for your dental infection. Take Vicodin for severe pain only. No driving or operating heavy machinery while taking vicodin. This medication may cause drowsiness.  Dental Pain A tooth ache may be caused by cavities (tooth decay). Cavities expose the nerve of the tooth to air and hot or cold temperatures. It may come from an infection or abscess (also called a boil or furuncle) around your tooth. It is also often caused by dental caries (tooth decay). This causes the pain you are having. DIAGNOSIS  Your caregiver can diagnose this problem by exam. TREATMENT   If caused by an infection, it may be treated with medications which kill germs (antibiotics) and pain medications as prescribed by your caregiver. Take medications as directed.  Only take over-the-counter or prescription medicines for pain, discomfort, or fever as directed by your caregiver.  Whether the tooth ache today is caused by infection or dental disease, you should see your dentist as soon as possible for further care. SEEK MEDICAL CARE IF: The exam and treatment you received today has been provided on an emergency basis only. This is not a substitute for complete medical or dental care. If your problem worsens or new problems (symptoms) appear, and you are unable to meet with your dentist, call or return to this location. SEEK IMMEDIATE MEDICAL CARE IF:   You have a fever.  You develop redness and swelling of your face, jaw, or neck.  You are unable to open your mouth.  You have severe pain uncontrolled by pain medicine. MAKE SURE YOU:   Understand these instructions.  Will watch your condition.  Will get help right away if you are not doing well or get worse. Document Released: 04/04/2005 Document Revised: 06/27/2011 Document Reviewed: 11/21/2007 [facility] RehabilitationExitCare Patient Information 2014 [place]ExitCare, [place].  Facial Infection You have an infection of your face. This requires special attention to help  prevent serious problems. Infections in facial wounds can cause poor healing and scars. They can also spread to deeper tissues, especially around the eye. Wound and dental infections can lead to sinusitis, infection of the eye socket, and even meningitis. Permanent damage to the skin, eye, and nervous system may result if facial infections are not treated properly. With severe infections, hospital care for IV antibiotic injections may be needed if they don't respond to oral antibiotics. Antibiotics must be taken for the full course to insure the infection is eliminated. If the infection came from a bad tooth, it may have to be extracted when the infection is under control. Warm compresses may be applied to reduce skin irritation and remove drainage. You might need a tetanus shot now if:  You cannot remember when your last tetanus shot was.  You have never had a tetanus shot.  The object that caused your wound was dirty. If you need a tetanus shot, and you decide not to get one, there is a rare chance of getting tetanus. Sickness from tetanus can be serious. If you got a tetanus shot, your arm may swell, get red and warm to the touch at the shot site. This is common and not a problem. SEEK IMMEDIATE MEDICAL CARE IF:   You have increased swelling, redness, or trouble breathing.  You have a severe headache, dizziness, nausea, or vomiting.  You develop problems with your eyesight.  You have a fever. Document Released: 05/12/2004 Document Revised: 06/27/2011 Document Reviewed: 04/04/2005 [facility] Patient Information 2014 [place]Care, [place].

## 2013-08-16 NOTE — ED Provider Notes (Signed)
Medical screening examination/treatment/procedure(s) were performed by non-physician practitioner and as supervising physician I was immediately available for consultation/collaboration.    Georgian Mcclory R Masoud Nyce, MD 08/16/13 1548 

## 2013-08-26 ENCOUNTER — Ambulatory Visit (INDEPENDENT_AMBULATORY_CARE_PROVIDER_SITE_OTHER): Payer: Medicaid Other | Admitting: General Practice

## 2013-08-26 VITALS — BP 117/82 | HR 105 | Temp 98.5°F | Ht 61.0 in | Wt 116.3 lb

## 2013-08-26 DIAGNOSIS — Z0130 Encounter for examination of blood pressure without abnormal findings: Secondary | ICD-10-CM

## 2013-08-26 DIAGNOSIS — Z136 Encounter for screening for cardiovascular disorders: Secondary | ICD-10-CM

## 2013-08-26 NOTE — Progress Notes (Signed)
Patient here for second BP check since delivery. Reviewed today's BP and history with Dr Ike Benedom. Dr Ike Benedom satisfied with patient's blood pressure. Patient coming back 5/22 for pp visit

## 2013-09-06 ENCOUNTER — Encounter: Payer: Self-pay | Admitting: General Practice

## 2013-09-06 ENCOUNTER — Ambulatory Visit: Payer: Self-pay | Admitting: Obstetrics & Gynecology

## 2013-09-06 ENCOUNTER — Telehealth: Payer: Self-pay | Admitting: General Practice

## 2013-09-06 NOTE — Telephone Encounter (Signed)
Patient no showed for appt. Called patient, no answer- left message that we are trying to call you in regards to the appt you missed today, please call our front office staff to reschedule this appt. Will send letter

## 2013-11-05 ENCOUNTER — Encounter (HOSPITAL_COMMUNITY): Payer: Self-pay | Admitting: Emergency Medicine

## 2013-11-05 ENCOUNTER — Emergency Department (HOSPITAL_COMMUNITY)
Admission: EM | Admit: 2013-11-05 | Discharge: 2013-11-05 | Disposition: A | Discharge status: Home / Self Care | Payer: Medicaid Other | Attending: Emergency Medicine | Admitting: Emergency Medicine

## 2013-11-05 ENCOUNTER — Emergency Department (HOSPITAL_COMMUNITY): Payer: Medicaid Other

## 2013-11-05 DIAGNOSIS — S8010XA Contusion of unspecified lower leg, initial encounter: Secondary | ICD-10-CM | POA: Insufficient documentation

## 2013-11-05 DIAGNOSIS — S8011XA Contusion of right lower leg, initial encounter: Secondary | ICD-10-CM

## 2013-11-05 DIAGNOSIS — Y9389 Activity, other specified: Secondary | ICD-10-CM | POA: Insufficient documentation

## 2013-11-05 DIAGNOSIS — S99929A Unspecified injury of unspecified foot, initial encounter: Principal | ICD-10-CM

## 2013-11-05 DIAGNOSIS — S99919A Unspecified injury of unspecified ankle, initial encounter: Principal | ICD-10-CM

## 2013-11-05 DIAGNOSIS — S8990XA Unspecified injury of unspecified lower leg, initial encounter: Secondary | ICD-10-CM | POA: Insufficient documentation

## 2013-11-05 DIAGNOSIS — Z23 Encounter for immunization: Secondary | ICD-10-CM | POA: Insufficient documentation

## 2013-11-05 DIAGNOSIS — T148XXA Other injury of unspecified body region, initial encounter: Secondary | ICD-10-CM

## 2013-11-05 DIAGNOSIS — I10 Essential (primary) hypertension: Secondary | ICD-10-CM | POA: Insufficient documentation

## 2013-11-05 DIAGNOSIS — Y9289 Other specified places as the place of occurrence of the external cause: Secondary | ICD-10-CM | POA: Insufficient documentation

## 2013-11-05 HISTORY — DX: Essential (primary) hypertension: I10

## 2013-11-05 MED ORDER — TETANUS-DIPHTH-ACELL PERTUSSIS 5-2.5-18.5 LF-MCG/0.5 IM SUSP
0.5000 mL | Freq: Once | INTRAMUSCULAR | Status: AC
  Administered 2013-11-05: 0.5 mL via INTRAMUSCULAR
  Filled 2013-11-05: qty 0.5

## 2013-11-05 MED ORDER — OXYCODONE-ACETAMINOPHEN 5-325 MG PO TABS
1.0000 | ORAL_TABLET | Freq: Once | ORAL | Status: AC
  Administered 2013-11-05: 1 via ORAL
  Filled 2013-11-05: qty 1

## 2013-11-05 MED ORDER — OXYCODONE-ACETAMINOPHEN 5-325 MG PO TABS: 1.0000 | ORAL_TABLET | Freq: Four times a day (QID) | ORAL | Status: AC | PRN

## 2013-11-05 NOTE — ED Notes (Signed)
Pt ambulatory slow and with limp
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
## 2013-11-05 NOTE — Discharge Instructions (Signed)
Abrasions °An abrasion is a cut or scrape of the skin. Abrasions do not go through all layers of the skin. °HOME CARE °· If a bandage (dressing) was put on your wound, change it as told by your doctor. If the bandage sticks, soak it off with warm. °· Wash the area with water and soap 2 times a day. Rinse off the soap. Pat the area dry with a clean towel. °· Put on medicated cream (ointment) as told by your doctor. °· Change your bandage right away if it gets wet or dirty. °· Only take medicine as told by your doctor. °· See your doctor within 24-48 hours to get your wound checked. °· Check your wound for redness, puffiness (swelling), or yellowish-white fluid (pus). °GET HELP RIGHT AWAY IF:  °· You have more pain in the wound. °· You have redness, swelling, or tenderness around the wound. °· You have pus coming from the wound. °· You have a fever or lasting symptoms for more than 2-3 days. °· You have a fever and your symptoms suddenly get worse. °· You have a bad smell coming from the wound or bandage. °MAKE SURE YOU:  °· Understand these instructions. °· Will watch your condition. °· Will get help right away if you are not doing well or get worse. °Document Released: 09/21/2007 Document Revised: 12/28/2011 Document Reviewed: 03/08/2011 °ExitCare® Patient Information ©2015 [employer]. This information is not intended to replace advice given to you by your health care provider. Make sure you discuss any questions you have with your health care provider. ° °Contusion °A contusion is a deep bruise. Contusions are the result of an injury that caused bleeding under the skin. The contusion may turn blue, purple, or yellow. Minor injuries will give you a painless contusion, but more severe contusions may stay painful and swollen for a few weeks.  °CAUSES  °A contusion is usually caused by a blow, trauma, or direct force to an area of the body. °SYMPTOMS  °· Swelling and redness of the injured area. °· Bruising of the  injured area. °· Tenderness and soreness of the injured area. °· Pain. °DIAGNOSIS  °The diagnosis can be made by taking a history and physical exam. An X-ray, CT scan, or MRI may be needed to determine if there were any associated injuries, such as fractures. °TREATMENT  °Specific treatment will depend on what area of the body was injured. In general, the best treatment for a contusion is resting, icing, elevating, and applying cold compresses to the injured area. Over-the-counter medicines may also be recommended for pain control. Ask your caregiver what the best treatment is for your contusion. °HOME CARE INSTRUCTIONS  °· Put ice on the injured area. °¨ Put ice in a plastic bag. °¨ Place a towel between your skin and the bag. °¨ Leave the ice on for 15-20 minutes, 3-4 times a day, or as directed by your health care provider. °· Only take over-the-counter or prescription medicines for pain, discomfort, or fever as directed by your caregiver. Your caregiver may recommend avoiding anti-inflammatory medicines (aspirin, ibuprofen, and naproxen) for 48 hours because these medicines may increase bruising. °· Rest the injured area. °· If possible, elevate the injured area to reduce swelling. °SEEK IMMEDIATE MEDICAL CARE IF:  °· You have increased bruising or swelling. °· You have pain that is getting worse. °· Your swelling or pain is not relieved with medicines. °MAKE SURE YOU:  °· Understand these instructions. °· Will watch your condition. °·   Will get help right away if you are not doing well or get worse. °Document Released: 01/12/2005 Document Revised: 04/09/2013 Document Reviewed: 02/07/2011 °ExitCare® Patient Information ©2015 [employer]. This information is not intended to replace advice given to you by your health care provider. Make sure you discuss any questions you have with your health care provider. ° °
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
## 2014-02-17 ENCOUNTER — Encounter (HOSPITAL_COMMUNITY): Payer: Self-pay | Admitting: Emergency Medicine

## 2015-03-11 IMAGING — CR DG TIBIA/FIBULA 2V*R*
3 series · 3 of 3 positions shown · non-contrast
Comparison: None.

CLINICAL DATA: Injury to RIGHT leg. Laceration along the medial
knee.

EXAM:
RIGHT TIBIA AND FIBULA - 2 VIEW

[t tib-fib ap right (1 of 2)]
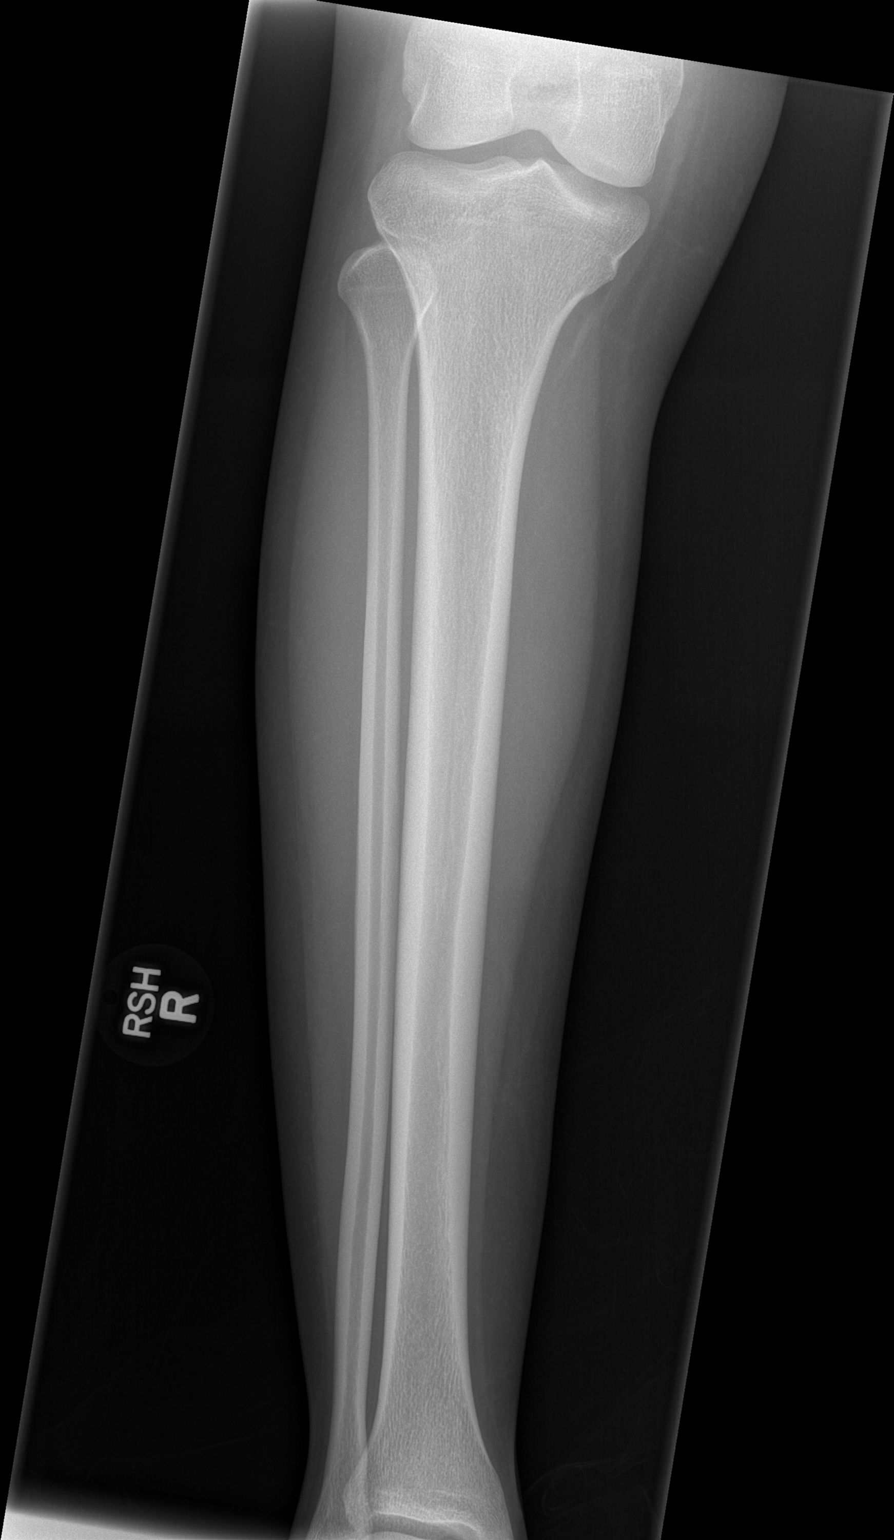

[t tib-fib ap right (2 of 2)]
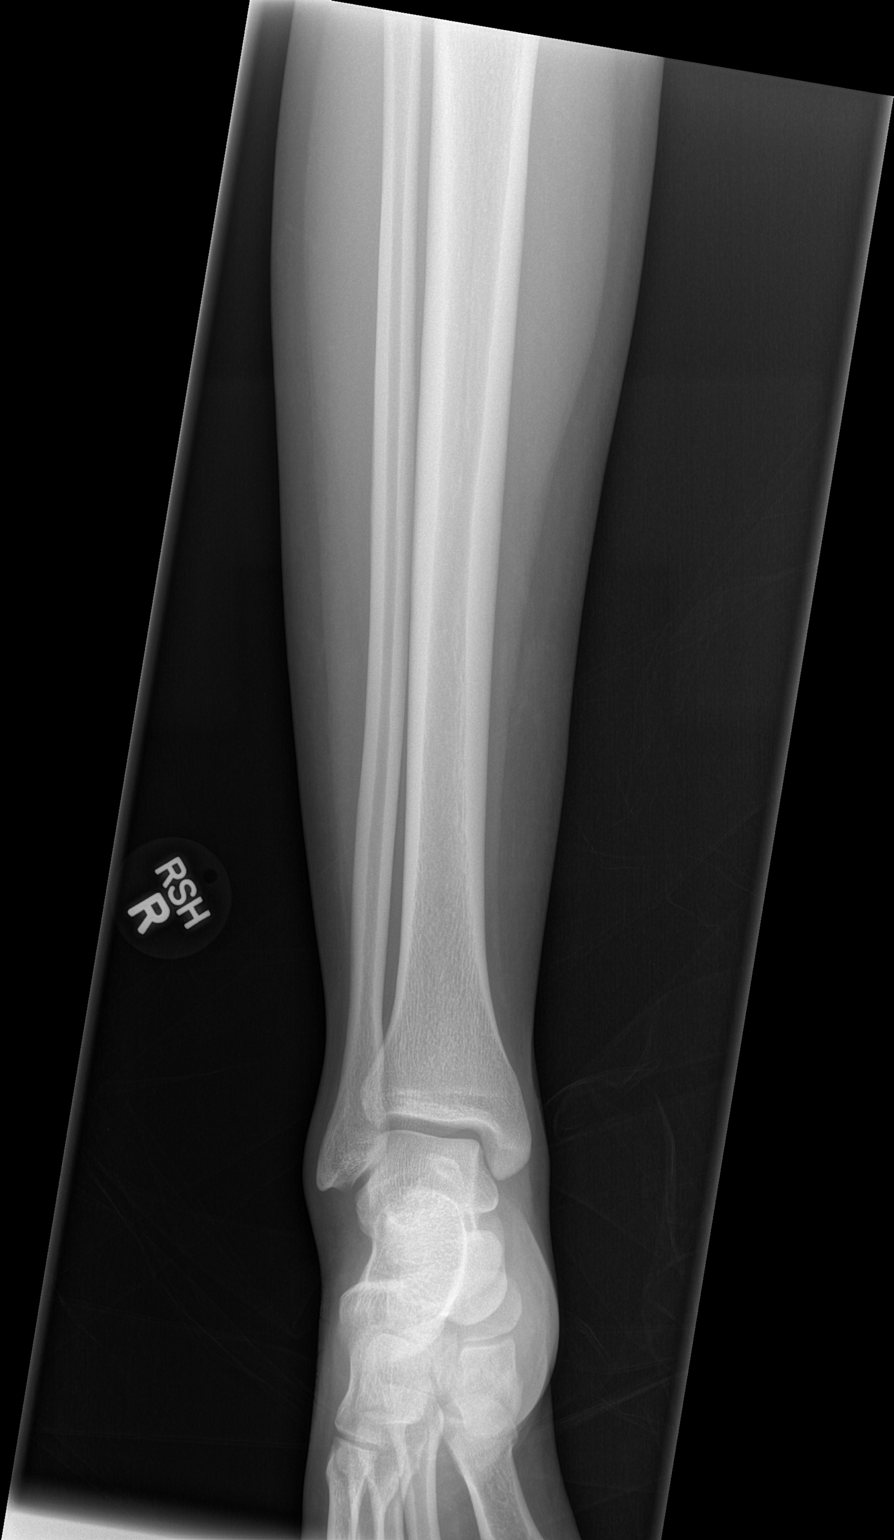

[t tib-fib lat right]
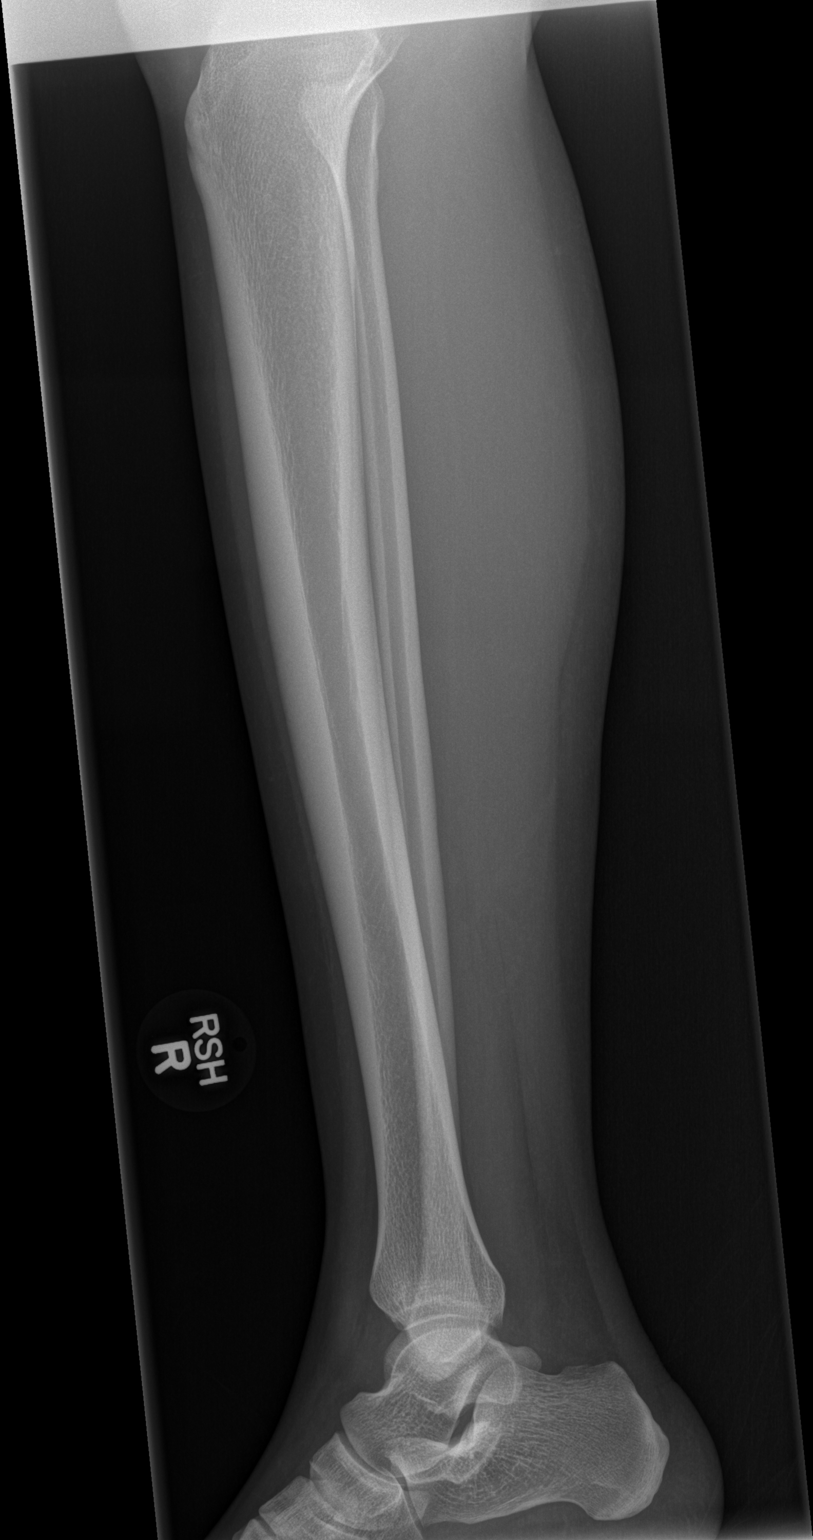

[3 of 3 positions shown; findings below may reference images not displayed]

FINDINGS: There is mild lateral bowing of the proximal tibia on the frontal
view with cortical buckling at the proximal medial tibial
metaphysis. This is most compatible with a developmental abnormality
and may represent an old injury. Distal tibia and fibula appear
within normal limits. There is no fracture, radiopaque foreign body
or acute osseous abnormality.
IMPRESSION: No acute osseous abnormality.
